# Patient Record
Sex: Male | Born: 1945 | Race: White | Hispanic: No | Marital: Married | Smoking: Former smoker
Health system: Southern US, Community
[De-identification: ages and names within clinical notes are randomized; demographics above are authoritative.]

## PROBLEM LIST (undated history)

## (undated) DIAGNOSIS — K3184 Gastroparesis: Secondary | ICD-10-CM

## (undated) DIAGNOSIS — E7800 Pure hypercholesterolemia, unspecified: Secondary | ICD-10-CM

## (undated) DIAGNOSIS — K2970 Gastritis, unspecified, without bleeding: Secondary | ICD-10-CM

## (undated) DIAGNOSIS — N399 Disorder of urinary system, unspecified: Secondary | ICD-10-CM

## (undated) DIAGNOSIS — I10 Essential (primary) hypertension: Secondary | ICD-10-CM

## (undated) DIAGNOSIS — E1140 Type 2 diabetes mellitus with diabetic neuropathy, unspecified: Secondary | ICD-10-CM

## (undated) DIAGNOSIS — G44009 Cluster headache syndrome, unspecified, not intractable: Secondary | ICD-10-CM

## (undated) DIAGNOSIS — N3090 Cystitis, unspecified without hematuria: Secondary | ICD-10-CM

## (undated) DIAGNOSIS — K5190 Ulcerative colitis, unspecified, without complications: Secondary | ICD-10-CM

## (undated) DIAGNOSIS — I4819 Other persistent atrial fibrillation: Secondary | ICD-10-CM

## (undated) DIAGNOSIS — R351 Nocturia: Secondary | ICD-10-CM

## (undated) DIAGNOSIS — N200 Calculus of kidney: Secondary | ICD-10-CM

## (undated) DIAGNOSIS — R350 Frequency of micturition: Secondary | ICD-10-CM

## (undated) DIAGNOSIS — I8500 Esophageal varices without bleeding: Secondary | ICD-10-CM

## (undated) DIAGNOSIS — K7460 Unspecified cirrhosis of liver: Secondary | ICD-10-CM

## (undated) DIAGNOSIS — D731 Hypersplenism: Secondary | ICD-10-CM

## (undated) DIAGNOSIS — F17200 Nicotine dependence, unspecified, uncomplicated: Secondary | ICD-10-CM

## (undated) DIAGNOSIS — K219 Gastro-esophageal reflux disease without esophagitis: Secondary | ICD-10-CM

## (undated) DIAGNOSIS — E119 Type 2 diabetes mellitus without complications: Secondary | ICD-10-CM

## (undated) DIAGNOSIS — K220 Achalasia of cardia: Secondary | ICD-10-CM

## (undated) DIAGNOSIS — E785 Hyperlipidemia, unspecified: Secondary | ICD-10-CM

## (undated) DIAGNOSIS — G4730 Sleep apnea, unspecified: Secondary | ICD-10-CM

## (undated) DIAGNOSIS — R519 Headache, unspecified: Secondary | ICD-10-CM

## (undated) DIAGNOSIS — G629 Polyneuropathy, unspecified: Secondary | ICD-10-CM

## (undated) DIAGNOSIS — Z950 Presence of cardiac pacemaker: Secondary | ICD-10-CM

## (undated) DIAGNOSIS — N189 Chronic kidney disease, unspecified: Secondary | ICD-10-CM

## (undated) DIAGNOSIS — H9190 Unspecified hearing loss, unspecified ear: Secondary | ICD-10-CM

## (undated) DIAGNOSIS — N400 Enlarged prostate without lower urinary tract symptoms: Secondary | ICD-10-CM

## (undated) DIAGNOSIS — I4891 Unspecified atrial fibrillation: Secondary | ICD-10-CM

## (undated) DIAGNOSIS — K5900 Constipation, unspecified: Secondary | ICD-10-CM

## (undated) DIAGNOSIS — G459 Transient cerebral ischemic attack, unspecified: Secondary | ICD-10-CM

## (undated) DIAGNOSIS — R51 Headache: Secondary | ICD-10-CM

## (undated) DIAGNOSIS — D61818 Other pancytopenia: Secondary | ICD-10-CM

## (undated) DIAGNOSIS — I499 Cardiac arrhythmia, unspecified: Secondary | ICD-10-CM

## (undated) HISTORY — DX: Essential (primary) hypertension: I10

## (undated) HISTORY — DX: Type 2 diabetes mellitus with diabetic neuropathy, unspecified (CMS HCC): E11.40

## (undated) HISTORY — PX: HX GALL BLADDER SURGERY/CHOLE: SHX55

## (undated) HISTORY — DX: Type 2 diabetes mellitus without complications (CMS HCC): E11.9

## (undated) HISTORY — PX: HX LITHOTRIPSY: SHX66

## (undated) HISTORY — DX: Achalasia of cardia: K22.0

## (undated) HISTORY — DX: Hypersplenism: D73.1

## (undated) HISTORY — DX: Disorder of urinary system, unspecified: N39.9

## (undated) HISTORY — DX: Nocturia: R35.1

## (undated) HISTORY — PX: URETEROSCOPY: SHX842

## (undated) HISTORY — PX: HX KNEE SURGERY: 2100001320

## (undated) HISTORY — PX: NOSE SURGERY: SHX723

## (undated) HISTORY — DX: Gastroparesis: K31.84

## (undated) HISTORY — DX: Gastro-esophageal reflux disease without esophagitis: K21.9

## (undated) HISTORY — DX: Pure hypercholesterolemia, unspecified: E78.00

## (undated) HISTORY — DX: Calculus of kidney: N20.0

## (undated) HISTORY — DX: Unspecified cirrhosis of liver (CMS HCC): K74.60

## (undated) HISTORY — PX: COLONOSCOPY: WVUENDOPRO10

## (undated) HISTORY — DX: Nicotine dependence, unspecified, uncomplicated: F17.200

## (undated) HISTORY — DX: Gastritis, unspecified, without bleeding: K29.70

## (undated) HISTORY — DX: Cluster headache syndrome, unspecified, not intractable: G44.009

## (undated) HISTORY — DX: Cystitis, unspecified without hematuria: N30.90

## (undated) HISTORY — DX: Ulcerative colitis, unspecified, without complications (CMS HCC): K51.90

## (undated) HISTORY — DX: Frequency of micturition: R35.0

## (undated) HISTORY — DX: Other persistent atrial fibrillation: I48.19

## (undated) HISTORY — PX: HX KNEE REPLACMENT: SHX125

## (undated) HISTORY — DX: Esophageal varices without bleeding (CMS HCC): I85.00

## (undated) HISTORY — PX: CHOLECYSTECTOMY: SHX55

## (undated) HISTORY — PX: LITHOTRIPSY: SUR834

## (undated) HISTORY — PX: VASECTOMY: SHX75

## (undated) HISTORY — PX: KIDNEY SURGERY: SHX687

## (undated) HISTORY — PX: JOINT REPLACEMENT: SHX530

---

## 2009-09-03 ENCOUNTER — Other Ambulatory Visit: Payer: Self-pay

## 2009-09-04 LAB — HISTORICAL SURGICAL PATHOLOGY SPECIMEN

## 2009-09-09 ENCOUNTER — Other Ambulatory Visit: Payer: Self-pay

## 2009-09-10 LAB — HISTORICAL SURGICAL PATHOLOGY SPECIMEN

## 2009-12-04 ENCOUNTER — Other Ambulatory Visit: Payer: Self-pay

## 2009-12-05 LAB — HISTORICAL SURGICAL PATHOLOGY SPECIMEN

## 2011-07-07 ENCOUNTER — Inpatient Hospital Stay (HOSPITAL_COMMUNITY): Payer: Self-pay | Admitting: Specialist

## 2015-08-19 ENCOUNTER — Emergency Department: Payer: Medicare Other

## 2015-08-19 ENCOUNTER — Observation Stay: Payer: Medicare Other

## 2015-08-19 ENCOUNTER — Encounter: Payer: Self-pay | Admitting: Emergency Medicine

## 2015-08-19 ENCOUNTER — Observation Stay
Admission: EM | Admit: 2015-08-19 | Discharge: 2015-08-20 | Disposition: A | Discharge status: Home / Self Care | Payer: Medicare Other | Attending: Specialist | Admitting: Specialist

## 2015-08-19 ENCOUNTER — Observation Stay
Admit: 2015-08-19 | Discharge: 2015-08-19 | Disposition: A | Discharge status: Home / Self Care | Payer: Medicare Other | Attending: Internal Medicine | Admitting: Internal Medicine

## 2015-08-19 DIAGNOSIS — D72819 Decreased white blood cell count, unspecified: Secondary | ICD-10-CM | POA: Insufficient documentation

## 2015-08-19 DIAGNOSIS — I119 Hypertensive heart disease without heart failure: Secondary | ICD-10-CM | POA: Insufficient documentation

## 2015-08-19 DIAGNOSIS — Z23 Encounter for immunization: Secondary | ICD-10-CM | POA: Diagnosis not present

## 2015-08-19 DIAGNOSIS — R4781 Slurred speech: Secondary | ICD-10-CM | POA: Insufficient documentation

## 2015-08-19 DIAGNOSIS — I700 Atherosclerosis of aorta: Secondary | ICD-10-CM | POA: Insufficient documentation

## 2015-08-19 DIAGNOSIS — E785 Hyperlipidemia, unspecified: Secondary | ICD-10-CM | POA: Diagnosis not present

## 2015-08-19 DIAGNOSIS — G459 Transient cerebral ischemic attack, unspecified: Principal | ICD-10-CM | POA: Diagnosis present

## 2015-08-19 DIAGNOSIS — Z79899 Other long term (current) drug therapy: Secondary | ICD-10-CM | POA: Insufficient documentation

## 2015-08-19 DIAGNOSIS — K219 Gastro-esophageal reflux disease without esophagitis: Secondary | ICD-10-CM | POA: Diagnosis not present

## 2015-08-19 DIAGNOSIS — Z87891 Personal history of nicotine dependence: Secondary | ICD-10-CM | POA: Insufficient documentation

## 2015-08-19 DIAGNOSIS — R55 Syncope and collapse: Secondary | ICD-10-CM | POA: Diagnosis not present

## 2015-08-19 DIAGNOSIS — X58XXXA Exposure to other specified factors, initial encounter: Secondary | ICD-10-CM | POA: Insufficient documentation

## 2015-08-19 DIAGNOSIS — I252 Old myocardial infarction: Secondary | ICD-10-CM | POA: Insufficient documentation

## 2015-08-19 DIAGNOSIS — D696 Thrombocytopenia, unspecified: Secondary | ICD-10-CM | POA: Diagnosis not present

## 2015-08-19 DIAGNOSIS — E1140 Type 2 diabetes mellitus with diabetic neuropathy, unspecified: Secondary | ICD-10-CM | POA: Diagnosis not present

## 2015-08-19 DIAGNOSIS — R0789 Other chest pain: Secondary | ICD-10-CM | POA: Insufficient documentation

## 2015-08-19 DIAGNOSIS — Z823 Family history of stroke: Secondary | ICD-10-CM | POA: Diagnosis not present

## 2015-08-19 DIAGNOSIS — Z794 Long term (current) use of insulin: Secondary | ICD-10-CM | POA: Insufficient documentation

## 2015-08-19 DIAGNOSIS — S2232XA Fracture of one rib, left side, initial encounter for closed fracture: Secondary | ICD-10-CM | POA: Diagnosis not present

## 2015-08-19 DIAGNOSIS — R4701 Aphasia: Secondary | ICD-10-CM | POA: Diagnosis not present

## 2015-08-19 DIAGNOSIS — Z8249 Family history of ischemic heart disease and other diseases of the circulatory system: Secondary | ICD-10-CM | POA: Insufficient documentation

## 2015-08-19 DIAGNOSIS — R29810 Facial weakness: Secondary | ICD-10-CM | POA: Diagnosis not present

## 2015-08-19 DIAGNOSIS — E119 Type 2 diabetes mellitus without complications: Secondary | ICD-10-CM

## 2015-08-19 HISTORY — DX: Gastro-esophageal reflux disease without esophagitis: K21.9

## 2015-08-19 HISTORY — DX: Type 2 diabetes mellitus without complications: E11.9

## 2015-08-19 HISTORY — DX: Hyperlipidemia, unspecified: E78.5

## 2015-08-19 HISTORY — DX: Essential (primary) hypertension: I10

## 2015-08-19 HISTORY — DX: Constipation, unspecified: K59.00

## 2015-08-19 LAB — COMPREHENSIVE METABOLIC PANEL
ALT: 52 U/L (ref 17–63)
AST: 55 U/L — AB (ref 15–41)
Albumin: 3.7 g/dL (ref 3.5–5.0)
Alkaline Phosphatase: 113 U/L (ref 38–126)
Anion gap: 6 (ref 5–15)
BILIRUBIN TOTAL: 1.2 mg/dL (ref 0.3–1.2)
BUN: 17 mg/dL (ref 6–20)
CO2: 27 mmol/L (ref 22–32)
CREATININE: 0.73 mg/dL (ref 0.61–1.24)
Calcium: 9 mg/dL (ref 8.9–10.3)
Chloride: 104 mmol/L (ref 101–111)
GFR calc Af Amer: >60 mL/min (ref >60)
Glucose, Bld: 255 mg/dL — ABNORMAL HIGH (ref 65–99)
POTASSIUM: 3.9 mmol/L (ref 3.5–5.1)
Sodium: 137 mmol/L (ref 135–145)
TOTAL PROTEIN: 6.1 g/dL — AB (ref 6.5–8.1)

## 2015-08-19 LAB — CBC
HEMATOCRIT: 38.8 % — AB (ref 40.0–52.0)
HEMOGLOBIN: 13 g/dL (ref 13.0–18.0)
MCH: 35.8 pg — ABNORMAL HIGH (ref 26.0–34.0)
MCHC: 33.6 g/dL (ref 32.0–36.0)
MCV: 106.6 fL — AB (ref 80.0–100.0)
Platelets: 64 10*3/uL — ABNORMAL LOW (ref 150–440)
RBC: 3.64 MIL/uL — ABNORMAL LOW (ref 4.40–5.90)
RDW: 13.4 % (ref 11.5–14.5)
WBC: 3.4 10*3/uL — ABNORMAL LOW (ref 3.8–10.6)

## 2015-08-19 LAB — DIFFERENTIAL
BASOS ABS: 0 10*3/uL (ref 0–0.1)
Basophils Relative: 0 %
EOS ABS: 0 10*3/uL (ref 0–0.7)
Eosinophils Relative: 1 %
LYMPHS ABS: 0.9 10*3/uL — AB (ref 1.0–3.6)
Lymphocytes Relative: 26 %
Monocytes Absolute: 0.4 10*3/uL (ref 0.2–1.0)
Neutro Abs: 2.1 10*3/uL (ref 1.4–6.5)
Neutrophils Relative %: 62 %

## 2015-08-19 LAB — PROTIME-INR
INR: 1.37
Prothrombin Time: 17 seconds — ABNORMAL HIGH (ref 11.4–15.0)

## 2015-08-19 LAB — APTT: APTT: 29 s (ref 24–36)

## 2015-08-19 LAB — TROPONIN I

## 2015-08-19 LAB — GLUCOSE, CAPILLARY
GLUCOSE-CAPILLARY: 185 mg/dL — AB (ref 65–99)
Glucose-Capillary: 137 mg/dL — ABNORMAL HIGH (ref 65–99)

## 2015-08-19 MED ORDER — SENNOSIDES-DOCUSATE SODIUM 8.6-50 MG PO TABS: 1.0000 | ORAL_TABLET | Freq: Every evening | ORAL | Status: DC | PRN

## 2015-08-19 MED ORDER — FERROUS SULFATE 325 (65 FE) MG PO TABS
325.0000 mg | ORAL_TABLET | Freq: Two times a day (BID) | ORAL | Status: DC
  Administered 2015-08-19 – 2015-08-20 (×2): 325 mg via ORAL
  Filled 2015-08-19 (×2): qty 1

## 2015-08-19 MED ORDER — NIACIN 500 MG PO TABS
1000.0000 mg | ORAL_TABLET | Freq: Two times a day (BID) | ORAL | Status: DC
  Administered 2015-08-19 – 2015-08-20 (×2): 1000 mg via ORAL
  Filled 2015-08-19 (×3): qty 2

## 2015-08-19 MED ORDER — ASPIRIN 325 MG PO TABS
325.0000 mg | ORAL_TABLET | Freq: Every day | ORAL | Status: DC
  Administered 2015-08-20: 325 mg via ORAL
  Filled 2015-08-19: qty 1

## 2015-08-19 MED ORDER — SULFASALAZINE 500 MG PO TABS
1000.0000 mg | ORAL_TABLET | Freq: Two times a day (BID) | ORAL | Status: DC
  Administered 2015-08-19 – 2015-08-20 (×2): 1000 mg via ORAL
  Filled 2015-08-19 (×3): qty 2

## 2015-08-19 MED ORDER — CAPTOPRIL 12.5 MG PO TABS
12.5000 mg | ORAL_TABLET | Freq: Two times a day (BID) | ORAL | Status: DC
  Administered 2015-08-19 – 2015-08-20 (×2): 12.5 mg via ORAL
  Filled 2015-08-19 (×3): qty 1

## 2015-08-19 MED ORDER — ENOXAPARIN SODIUM 40 MG/0.4ML ~~LOC~~ SOLN
40.0000 mg | SUBCUTANEOUS | Status: DC
  Administered 2015-08-19: 17:00:00 40 mg via SUBCUTANEOUS
  Filled 2015-08-19: qty 0.4

## 2015-08-19 MED ORDER — LOPERAMIDE HCL 2 MG PO CAPS: 2.0000 mg | ORAL_CAPSULE | ORAL | Status: DC | PRN

## 2015-08-19 MED ORDER — INSULIN DETEMIR 100 UNIT/ML ~~LOC~~ SOLN
16.0000 [IU] | Freq: Two times a day (BID) | SUBCUTANEOUS | Status: DC
  Administered 2015-08-19 – 2015-08-20 (×2): 16 [IU] via SUBCUTANEOUS
  Filled 2015-08-19 (×3): qty 0.16

## 2015-08-19 MED ORDER — INSULIN ASPART 100 UNIT/ML ~~LOC~~ SOLN
0.0000 [IU] | Freq: Three times a day (TID) | SUBCUTANEOUS | Status: DC
  Administered 2015-08-19: 17:00:00 1 [IU] via SUBCUTANEOUS
  Administered 2015-08-20: 2 [IU] via SUBCUTANEOUS
  Filled 2015-08-19: qty 1
  Filled 2015-08-19: qty 2

## 2015-08-19 MED ORDER — DOCUSATE SODIUM 100 MG PO CAPS
100.0000 mg | ORAL_CAPSULE | Freq: Two times a day (BID) | ORAL | Status: DC
  Administered 2015-08-19 – 2015-08-20 (×2): 100 mg via ORAL
  Filled 2015-08-19 (×2): qty 1

## 2015-08-19 MED ORDER — CLONIDINE HCL 0.1 MG PO TABS
0.1000 mg | ORAL_TABLET | Freq: Three times a day (TID) | ORAL | Status: DC
  Administered 2015-08-19 – 2015-08-20 (×3): 0.1 mg via ORAL
  Filled 2015-08-19 (×3): qty 1

## 2015-08-19 MED ORDER — VERAPAMIL HCL 120 MG PO TABS
120.0000 mg | ORAL_TABLET | Freq: Three times a day (TID) | ORAL | Status: DC
  Administered 2015-08-19 – 2015-08-20 (×2): 120 mg via ORAL
  Filled 2015-08-19 (×5): qty 1

## 2015-08-19 MED ORDER — INFLUENZA VAC SPLIT QUAD 0.5 ML IM SUSY
0.5000 mL | PREFILLED_SYRINGE | INTRAMUSCULAR | Status: AC
  Administered 2015-08-20: 14:00:00 0.5 mL via INTRAMUSCULAR
  Filled 2015-08-19: qty 0.5

## 2015-08-19 MED ORDER — STROKE: EARLY STAGES OF RECOVERY BOOK
Freq: Once | Status: AC
  Administered 2015-08-19: 17:00:00

## 2015-08-19 MED ORDER — INSULIN ASPART 100 UNIT/ML ~~LOC~~ SOLN: 0.0000 [IU] | Freq: Every day | SUBCUTANEOUS | Status: DC

## 2015-08-19 MED ORDER — PRAVASTATIN SODIUM 40 MG PO TABS
40.0000 mg | ORAL_TABLET | Freq: Every day | ORAL | Status: DC
  Administered 2015-08-20: 09:00:00 40 mg via ORAL
  Filled 2015-08-19 (×2): qty 1

## 2015-08-19 MED ORDER — PANTOPRAZOLE SODIUM 40 MG PO TBEC
40.0000 mg | DELAYED_RELEASE_TABLET | Freq: Every day | ORAL | Status: DC
  Administered 2015-08-19 – 2015-08-20 (×2): 40 mg via ORAL
  Filled 2015-08-19 (×2): qty 1

## 2015-08-19 MED ORDER — GABAPENTIN 300 MG PO CAPS
900.0000 mg | ORAL_CAPSULE | Freq: Three times a day (TID) | ORAL | Status: DC
  Administered 2015-08-19 – 2015-08-20 (×3): 900 mg via ORAL
  Filled 2015-08-19 (×3): qty 3

## 2015-08-19 MED ORDER — ASPIRIN 300 MG RE SUPP: 300.0000 mg | Freq: Every day | RECTAL | Status: DC

## 2015-08-19 MED ORDER — POTASSIUM CHLORIDE CRYS ER 10 MEQ PO TBCR
10.0000 meq | EXTENDED_RELEASE_TABLET | Freq: Every day | ORAL | Status: DC
  Administered 2015-08-19 – 2015-08-20 (×2): 10 meq via ORAL
  Filled 2015-08-19 (×2): qty 1

## 2015-08-19 NOTE — ED Provider Notes (Signed)
[facility] Emergency Department Provider Note  ____________________________________________  Time seen: Approximately 12:27 PM  I have reviewed the triage vital signs and the nursing notes.   HISTORY  Chief Complaint Aphasia    HPI Bernard Pennington is a 69 y.o. male whose wife reports Saturday evening he got up from a nap and he had a droopy right side of face and had very slurry speech which she could not understand. The symptoms resolved within several minutes he did not want to go to the hospital so she did not take them. They're here visiting relatives and came in to be checked. There was an episode reported to the triage nurse of difficulty standing and walking last week as well which the patient does not now remember when I asked him about it. Patient reports he has had some chest pain which resolved with rest he had a stress test done in [facility] which she was initially told was positive and then was told was negative. We're attempting to get the report of that from that hospital.   Past Medical History  Diagnosis Date  . Hypertension   . Constipation   . Diabetes mellitus without complication ([place])   . GERD (gastroesophageal reflux disease)   . Hyperlipemia   . MI (myocardial infarction) ([place])    patient's past medical history also includes ulcerative colitis. Migraines closed head injury in [place] years ago and high cholesterol. Patient also has diabetic neuropathy in the feet  There are no active problems to display for this patient.   History reviewed. No pertinent past surgical history.  No current outpatient prescriptions on file.  Allergies Review of patient's allergies indicates no known allergies.  Family History  Problem Relation Age of Onset  . Heart attack Mother   . Stroke Father   . Cancer Father     Social History Social History  Substance Use Topics  . Smoking status: Former Research scientist (life sciences)  . Smokeless tobacco: None  .  Alcohol Use: Yes    Review of Systems Constitutional: No fever/chills Eyes: No visual changes. ENT: No sore throat. Cardiovascular: Denies chest pain. But see history of present illness Respiratory: Denies shortness of breath. Gastrointestinal: No abdominal pain.  No nausea, no vomiting.  No diarrhea.  No constipation. Genitourinary: Negative for dysuria. Musculoskeletal: Negative for back pain. Skin: Negative for rash. Neurological: Negative for headaches, focal weakness   10-point ROS otherwise negative.  ____________________________________________   PHYSICAL EXAM:  VITAL SIGNS: ED Triage Vitals  Enc Vitals Group     BP 08/19/15 1127 152/70 mmHg     Pulse Rate 08/19/15 1127 70     Resp 08/19/15 1127 20     Temp 08/19/15 1127 98.2 F (36.8 C)     Temp Source 08/19/15 1127 Oral     SpO2 08/19/15 1127 95 %     Weight 08/19/15 1127 200 lb (90.719 kg)     Height 08/19/15 1127 6\' 2"  (1.88 m)     Head Cir --      Peak Flow --      Pain Score 08/19/15 1128 3     Pain Loc --      Pain Edu? --      Excl. in [place]? --     Constitutional: Alert and oriented. Well appearing and in no acute distress. Eyes: Conjunctivae are normal. PERRL. EOMI. Head: Atraumatic. Nose: No congestion/rhinnorhea. Mouth/Throat: Mucous membranes are moist.  Oropharynx non-erythematous. Neck: No stridor.  No bruits  Cardiovascular: Normal rate, regular rhythm. Grossly normal heart sounds.  Good peripheral circulation. Respiratory: Normal respiratory effort.  No retractions. Lungs CTAB. Gastrointestinal: Soft and nontender. No distention. No abdominal bruits. No CVA tenderness. }Musculoskeletal: No lower extremity tenderness nor edema.  No joint effusions. Neurologic:  Normal speech and language. No gross focal neurologic deficits are appreciated. Cranial nerves II through XII are intact cerebellar finger-nose rapid alternating movements in the hands are normal heel-to-shin is slow but that's  bilaterally. There are no sensory or motor motor findings  Skin:  Skin is warm, dry and intact. No rash noted. Psychiatric: Mood and affect are normal. Speech and behavior are normal.  ____________________________________________   LABS (all labs ordered are listed, but only abnormal results are displayed)  Labs Reviewed  PROTIME-INR - Abnormal; Notable for the following:    Prothrombin Time 17.0 (*)    All other components within normal limits  CBC - Abnormal; Notable for the following:    WBC 3.4 (*)    RBC 3.64 (*)    HCT 38.8 (*)    MCV 106.6 (*)    MCH 35.8 (*)    Platelets 64 (*)    All other components within normal limits  DIFFERENTIAL - Abnormal; Notable for the following:    Lymphs Abs 0.9 (*)    All other components within normal limits  COMPREHENSIVE METABOLIC PANEL - Abnormal; Notable for the following:    Glucose, Bld 255 (*)    Total Protein 6.1 (*)    AST 55 (*)    All other components within normal limits  APTT  TROPONIN I  CBG MONITORING, ED   ____________________________________________  EKG  EKG shows normal sinus rhythm rate of 76 but there are frequent PVCs fact that almost looks like trigeminy. The axis is left axis  ____________________________________________  RADIOLOGY  CT shows no acute process Chest x-ray shows no acute process ____________________________________________   PROCEDURES    ____________________________________________   INITIAL IMPRESSION / ASSESSMENT AND PLAN / ED COURSE  Pertinent labs & imaging results that were available during my care of the patient were reviewed by me and considered in my medical decision making (see chart for details).   ____________________________________________   FINAL CLINICAL IMPRESSION(S) / ED DIAGNOSES  Final diagnoses:  Transient cerebral ischemia, unspecified transient cerebral ischemia type      [name], MD 08/19/15 1331

## 2015-08-19 NOTE — ED Notes (Signed)
Pt returned from ct; signed release form for medical records.

## 2015-08-19 NOTE — ED Notes (Addendum)
Pt from home (visiting family in the area) after having droopy face and slurred speech on Saturday night. Symptoms have resolved but pt's family wanted him evaluated. Pt also reports having difficulty standing and speaking one night a couple of weeks ago. NAD noted at this time. Wife comments that he seems to be sleeping more than usual lately.

## 2015-08-19 NOTE — ED Notes (Signed)
Pt to ed with c/o slurred speech and right sided facial drooping that started on Sat night late.  Pt wife awoke him from sleep in the chair and states pt had garbled speech and facial drooping for several minutes and then it resolved on its own.  Per pt he had similar episode last week.  Pt alert and oriented at this time.  No facial drooping noted, no slurred or garbled speech noted at this time.

## 2015-08-19 NOTE — Care Management Obs Status (Signed)
[facility] NOTIFICATION   Patient Details  Name: Brodix Salvino MRN: [id number] Date of Birth: September 16, 1946   Medicare Observation Status Notification Given:  Yes, sent to HIM    Beverly Sessions, RN 08/19/2015, 2:52 PM

## 2015-08-19 NOTE — Progress Notes (Signed)
*  PRELIMINARY RESULTS* Echocardiogram 2D Echocardiogram has been performed.  Bernard Pennington 08/19/2015, 9:04 PM

## 2015-08-19 NOTE — H&P (Signed)
[place] [facility] [place] NAME: Bernard Pennington    MR#:  [id number]  DATE OF BIRTH:  December 02, 1945  DATE OF ADMISSION:  08/19/2015  PRIMARY CARE PHYSICIAN: No primary care provider on file.   REQUESTING/REFERRING PHYSICIAN: [name], MD  CHIEF COMPLAINT:   Chief Complaint  Patient presents with  . Aphasia   slurred speech and resolve 2 days ago  HISTORY OF PRESENT ILLNESS:  Bernard Pennington  is a 69 y.o. male with a known history of hypertension, diabetes, recent MI and hyperlipidemia.  The patient was found to have a droopy on the right side of the face and slurred speech 3 days ago. Slurred speech resolved on the same day. The patient's family called EMS but has since symptoms resolved the patient didn't come to the ED. However, according to patient's wife patient has had similar episodes for the past few months. She has concern for stroke. The patient is traveling from [place] to [place]. He denies any other symptoms.  PAST MEDICAL HISTORY:   Past Medical History  Diagnosis Date  . Hypertension   . Constipation   . Diabetes mellitus without complication ([place])   . GERD (gastroesophageal reflux disease)   . Hyperlipemia   . MI (myocardial infarction) ([place])     PAST SURGICAL HISTORY:  History reviewed. No pertinent past surgical history.  SOCIAL HISTORY:   Social History  Substance Use Topics  . Smoking status: Former Research scientist (life sciences)  . Smokeless tobacco: Not on file  . Alcohol Use: Yes    FAMILY HISTORY:   Family History  Problem Relation Age of Onset  . Heart attack Mother   . Stroke Father   . Cancer Father     DRUG ALLERGIES:  No Known Allergies  REVIEW OF SYSTEMS:  CONSTITUTIONAL: No fever, fatigue or weakness.  EYES: No blurred or double vision.  EARS, NOSE, AND THROAT: No tinnitus or ear pain.  RESPIRATORY: No cough, shortness of breath, wheezing or hemoptysis.  CARDIOVASCULAR: No chest pain,  orthopnea, edema.  GASTROINTESTINAL: No nausea, vomiting, diarrhea or abdominal pain.  GENITOURINARY: No dysuria, hematuria.  ENDOCRINE: No polyuria, nocturia,  HEMATOLOGY: No anemia, easy bruising or bleeding SKIN: No rash or lesion. MUSCULOSKELETAL: No joint pain or arthritis.   NEUROLOGIC: No tingling, numbness, weakness. Resolved slurred speech 2 days ago that no dysphagia, syncope or loss of consciousness. No incontinence. PSYCHIATRY: No anxiety or depression.   MEDICATIONS AT HOME:   Prior to Admission medications   Medication Sig Start Date End Date Taking? Authorizing Provider  Ascorbic Acid (VITAMIN C) 1000 MG tablet Take 1,000 mg by mouth 3 (three) times daily.   Yes Historical Provider, MD  captopril (CAPOTEN) 12.5 MG tablet Take 12.5 mg by mouth 2 (two) times daily.   Yes Historical Provider, MD  CINNAMON PO Take 1 tablet by mouth 3 (three) times daily before meals.   Yes Historical Provider, MD  cloNIDine (CATAPRES) 0.1 MG tablet Take 0.1 mg by mouth 3 (three) times daily.   Yes Historical Provider, MD  docusate sodium (COLACE) 100 MG capsule Take 100 mg by mouth 2 (two) times daily.   Yes Historical Provider, MD  ferrous sulfate 325 (65 FE) MG tablet Take 325 mg by mouth 2 (two) times daily.   Yes Historical Provider, MD  gabapentin (NEURONTIN) 300 MG capsule Take 900 mg by mouth 3 (three) times daily.   Yes Historical Provider, MD  insulin NPH Human (HUMULIN N,NOVOLIN N)  100 UNIT/ML injection Inject 16 Units into the skin 2 (two) times daily.   Yes Historical Provider, MD  insulin regular (NOVOLIN R,HUMULIN R) 100 units/mL injection Inject 6-10 Units into the skin 4 (four) times daily. Pt uses per sliding scale.   Yes Historical Provider, MD  loperamide (IMODIUM) 2 MG capsule Take 2 mg by mouth as needed for diarrhea or loose stools.   Yes Historical Provider, MD  metFORMIN (GLUCOPHAGE) 500 MG tablet Take 500 mg by mouth 2 (two) times daily with a meal.   Yes Historical  Provider, MD  Multiple Vitamin (MULTIVITAMIN WITH MINERALS) TABS tablet Take 1 tablet by mouth daily.   Yes Historical Provider, MD  niacin 500 MG tablet Take 1,000 mg by mouth 2 (two) times daily.   Yes Historical Provider, MD  Omega-3 Fatty Acids (FISH OIL) 1000 MG CAPS Take 1,000 mg by mouth daily.   Yes Historical Provider, MD  omeprazole (PRILOSEC) 20 MG capsule Take 40 mg by mouth daily.   Yes Historical Provider, MD  potassium chloride SA (K-DUR,KLOR-CON) 20 MEQ tablet Take 10 mEq by mouth daily.   Yes Historical Provider, MD  pravastatin (PRAVACHOL) 40 MG tablet Take 40 mg by mouth daily.   Yes Historical Provider, MD  ranitidine (ZANTAC) 150 MG tablet Take 150 mg by mouth 2 (two) times daily as needed for heartburn.   Yes Historical Provider, MD  sulfaSALAzine (AZULFIDINE) 500 MG tablet Take 1,000 mg by mouth 2 (two) times daily.   Yes Historical Provider, MD  verapamil (CALAN) 120 MG tablet Take 120 mg by mouth 3 (three) times daily.   Yes Historical Provider, MD  VITAMIN E PO Take 1 capsule by mouth 2 (two) times daily.   Yes Historical Provider, MD      VITAL SIGNS:  Blood pressure 139/72, pulse 61, temperature 98.2 F (36.8 C), temperature source Oral, resp. rate 14, height 6\' 2"  (1.88 m), weight 90.719 kg (200 lb), SpO2 98 %.  PHYSICAL EXAMINATION:  GENERAL:  69 y.o.-year-old patient lying in the bed with no acute distress.  EYES: Pupils equal, round, reactive to light and accommodation. No scleral icterus. Extraocular muscles intact.  HEENT: Head atraumatic, normocephalic. Oropharynx and nasopharynx clear. Moist oral mucosa. NECK:  Supple, no jugular venous distention. No thyroid enlargement, no tenderness.  LUNGS: Normal breath sounds bilaterally, no wheezing, rales,rhonchi or crepitation. No use of accessory muscles of respiration.  CARDIOVASCULAR: S1, S2 normal. No murmurs, rubs, or gallops.  ABDOMEN: Soft, nontender, nondistended. Bowel sounds present. No organomegaly or  mass.  EXTREMITIES: No pedal edema, cyanosis, or clubbing.  NEUROLOGIC: Cranial nerves II through XII are intact. Muscle strength 5/5 in all extremities. Sensation intact. Gait not checked.  PSYCHIATRIC: The patient is alert and oriented x 3.  SKIN: No obvious rash, lesion, or ulcer.   LABORATORY PANEL:   CBC  Recent Labs Lab 08/19/15 1151  WBC 3.4*  HGB 13.0  HCT 38.8*  PLT 64*   ------------------------------------------------------------------------------------------------------------------  Chemistries   Recent Labs Lab 08/19/15 1151  NA 137  K 3.9  CL 104  CO2 27  GLUCOSE 255*  BUN 17  CREATININE 0.73  CALCIUM 9.0  AST 55*  ALT 52  ALKPHOS 113  BILITOT 1.2   ------------------------------------------------------------------------------------------------------------------  Cardiac Enzymes  Recent Labs Lab 08/19/15 1151  TROPONINI <0.03   ------------------------------------------------------------------------------------------------------------------  RADIOLOGY:  Dg Chest 2 View  08/19/2015  CLINICAL DATA:  69 year old male with a several month history of shortness of breath and left-sided chest  discomfort EXAM: CHEST  2 VIEW COMPARISON:  None. FINDINGS: The cardiac and mediastinal contours are within normal limits. Atherosclerotic calcifications are present within the thoracic aorta. No evidence of focal airspace consolidation, pleural effusion, pulmonary edema, pneumothorax or suspicious nodule. Age indeterminate but likely chronic fracture of left rib 7. Otherwise, no evidence of acute fracture or malalignment. IMPRESSION: 1. No acute cardiopulmonary process. 2. Aortic atherosclerosis. 3. Likely chronic fracture of left rib 7. Electronically Signed   By: Jacqulynn Cadet M.D.   On: 08/19/2015 12:54   Ct Head Wo Contrast  08/19/2015  CLINICAL DATA:  Slurred speech, right facial droop beginning Saturday night. EXAM: CT HEAD WITHOUT CONTRAST TECHNIQUE:  Contiguous axial images were obtained from the base of the skull through the vertex without intravenous contrast. COMPARISON:  None. FINDINGS: No acute intracranial abnormality. Specifically, no hemorrhage, hydrocephalus, mass lesion, acute infarction, or significant intracranial injury. No acute calvarial abnormality. Visualized paranasal sinuses and mastoids clear. Orbital soft tissues unremarkable. IMPRESSION: No acute intracranial abnormality. Electronically Signed   By: Rolm Baptise M.D.   On: 08/19/2015 12:48    EKG:   Orders placed or performed during the hospital encounter of 08/19/15  . EKG 12-Lead  . EKG 12-Lead  . ED EKG  . ED EKG    IMPRESSION AND PLAN:   TIA Hypertension Diabetes Recent MI Hyperlipidemia Leukopenia  Thrombocytopenia  The patient will be placed for observation. I will start TIA/stroke protocol. Give aspirin and statin, follow-up lipid panel, I MRI of the brain, carotid duplex and echocardiogram. Neuro check. Speech study, PT and OT evaluation. Continue hypertension medication but allow permissive blood pressure. Continue NPH and start sliding scale. Follow-up CBC.     All the records are reviewed and case discussed with ED provider. Management plans discussed with the patient, his wife and daughter and they are in agreement.  CODE STATUS: Full code  TOTAL TIME TAKING CARE OF THIS PATIENT: 48 minutes.    Demetrios Loll M.D on 08/19/2015 at 3:15 PM  Between 7am to 6pm - Pager - [phone]  After 6pm go to [email or web address] - password [email or web address] [facility]  [facility]  Office  [phone]  CC: Primary care physician; No primary care provider on file.

## 2015-08-19 NOTE — Plan of Care (Addendum)
Problem: Self-Care: Goal: Ability to participate in self-care as condition permits will improve Outcome: Progressing Patient has no complaints of pain. Telemetry monitored. NIH =0. VSS. Neuro checks performed. Patient has no deficits. Stroke booklet given to patient, all questions answered.

## 2015-08-20 ENCOUNTER — Observation Stay: Payer: Medicare Other

## 2015-08-20 DIAGNOSIS — G459 Transient cerebral ischemic attack, unspecified: Secondary | ICD-10-CM | POA: Diagnosis not present

## 2015-08-20 LAB — GLUCOSE, CAPILLARY
GLUCOSE-CAPILLARY: 80 mg/dL (ref 65–99)
Glucose-Capillary: 165 mg/dL — ABNORMAL HIGH (ref 65–99)

## 2015-08-20 LAB — LIPID PANEL
Cholesterol: 86 mg/dL (ref 0–200)
HDL: 19 mg/dL — AB (ref >40)
LDL Cholesterol: 55 mg/dL (ref 0–99)
TRIGLYCERIDES: 60 mg/dL (ref <150)
Total CHOL/HDL Ratio: 4.5 RATIO
VLDL: 12 mg/dL (ref 0–40)

## 2015-08-20 LAB — HEMOGLOBIN A1C: HEMOGLOBIN A1C: 5.4 % (ref 4.0–6.0)

## 2015-08-20 MED ORDER — VERAPAMIL HCL 120 MG PO TABS: 120.0000 mg | ORAL_TABLET | Freq: Two times a day (BID) | ORAL | Status: DC

## 2015-08-20 NOTE — Discharge Summary (Signed)
[facility] NAME: Bernard Pennington    MR#:  [id number]  DATE OF BIRTH:  Feb 04, 1946  DATE OF ADMISSION:  08/19/2015 ADMITTING PHYSICIAN: Demetrios Loll, MD  DATE OF DISCHARGE: 08/20/2015  2:29 PM  PRIMARY CARE PHYSICIAN: No primary care provider on file.    ADMISSION DIAGNOSIS:  Transient cerebral ischemia, unspecified transient cerebral ischemia type [G45.9]  DISCHARGE DIAGNOSIS:  Principal Problem:   TIA (transient ischemic attack)   SECONDARY DIAGNOSIS:   Past Medical History  Diagnosis Date  . Hypertension   . Constipation   . Diabetes mellitus without complication ([place])   . GERD (gastroesophageal reflux disease)   . Hyperlipemia   . MI (myocardial infarction) [facility])     HOSPITAL COURSE:   69 year old male with past medical history of diabetes, hypertension, hyperlipidemia, history of previous MI, who presented to the hospital due to slurred speech and therefore admitted for workup of possible stroke.  #1 slurred speech/CVA-this was a suspected diagnosis on admission. She does have significant risk factors given his history of diabetes, hypertension. -Patient underwent extensive neurologic workup including a CT head, MRI and MRA of the brain, carotid duplex are all essentially benign with no evidence of acute pathology. -Patient is clinically asymptomatic and therefore discharged home. He is not on aspirin daily given his thrombocytopenia and this can be further addressed with his primary care physician.  #2 bigeminy/PVCs-this was noted on his EKG and also on telemetry monitor. He was although clinically asymptomatic. He is currently on verapamil which she will continue and follow-up with his cardiologist as an outpatient. Patient does not live locally and follows up in [place]  #3 diabetes type 2 without, patient-patient will resume his home medications.  #4 hypertension-he remained hemodynamically stable.  He will continue his verapamil, clonidine.  #5 hyperlipidemia-patient will continue his Pravachol.  #6 GERD-patient will continue his omeprazole.   DISCHARGE CONDITIONS:   Stable  CONSULTS OBTAINED:     DRUG ALLERGIES:  No Known Allergies  DISCHARGE MEDICATIONS:   Discharge Medication List as of 08/20/2015 11:20 AM    CONTINUE these medications which have CHANGED   Details  verapamil (CALAN) 120 MG tablet Take 1 tablet (120 mg total) by mouth 2 (two) times daily., Starting 08/20/2015, Until Discontinued, No Print      CONTINUE these medications which have NOT CHANGED   Details  Ascorbic Acid (VITAMIN C) 1000 MG tablet Take 1,000 mg by mouth 3 (three) times daily., Until Discontinued, Historical Med    captopril (CAPOTEN) 12.5 MG tablet Take 12.5 mg by mouth 2 (two) times daily., Until Discontinued, Historical Med    CINNAMON PO Take 1 tablet by mouth 3 (three) times daily before meals., Until Discontinued, Historical Med    cloNIDine (CATAPRES) 0.1 MG tablet Take 0.1 mg by mouth 3 (three) times daily., Until Discontinued, Historical Med    docusate sodium (COLACE) 100 MG capsule Take 100 mg by mouth 2 (two) times daily., Until Discontinued, Historical Med    ferrous sulfate 325 (65 FE) MG tablet Take 325 mg by mouth 2 (two) times daily., Until Discontinued, Historical Med    gabapentin (NEURONTIN) 300 MG capsule Take 900 mg by mouth 3 (three) times daily., Until Discontinued, Historical Med    insulin NPH Human (HUMULIN N,NOVOLIN N) 100 UNIT/ML injection Inject 16 Units into the skin 2 (two) times daily., Until Discontinued, Historical Med    insulin regular (NOVOLIN R,HUMULIN R) 100 units/mL injection Inject  6-10 Units into the skin 4 (four) times daily. Pt uses per sliding scale., Until Discontinued, Historical Med    loperamide (IMODIUM) 2 MG capsule Take 2 mg by mouth as needed for diarrhea or loose stools., Until Discontinued, Historical Med    metFORMIN  (GLUCOPHAGE) 500 MG tablet Take 500 mg by mouth 2 (two) times daily with a meal., Until Discontinued, Historical Med    Multiple Vitamin (MULTIVITAMIN WITH MINERALS) TABS tablet Take 1 tablet by mouth daily., Until Discontinued, Historical Med    niacin 500 MG tablet Take 1,000 mg by mouth 2 (two) times daily., Until Discontinued, Historical Med    Omega-3 Fatty Acids (FISH OIL) 1000 MG CAPS Take 1,000 mg by mouth daily., Until Discontinued, Historical Med    omeprazole (PRILOSEC) 20 MG capsule Take 40 mg by mouth daily., Until Discontinued, Historical Med    potassium chloride SA (K-DUR,KLOR-CON) 20 MEQ tablet Take 10 mEq by mouth daily., Until Discontinued, Historical Med    pravastatin (PRAVACHOL) 40 MG tablet Take 40 mg by mouth daily., Until Discontinued, Historical Med    ranitidine (ZANTAC) 150 MG tablet Take 150 mg by mouth 2 (two) times daily as needed for heartburn., Until Discontinued, Historical Med    sulfaSALAzine (AZULFIDINE) 500 MG tablet Take 1,000 mg by mouth 2 (two) times daily., Until Discontinued, Historical Med    VITAMIN E PO Take 1 capsule by mouth 2 (two) times daily., Until Discontinued, Historical Med         DISCHARGE INSTRUCTIONS:   DIET:  Cardiac diet and Diabetic diet  DISCHARGE CONDITION:  Stable  ACTIVITY:  Activity as tolerated  OXYGEN:  Home Oxygen: No.   Oxygen Delivery: room air  DISCHARGE LOCATION:  home   If you experience worsening of your admission symptoms, develop shortness of breath, life threatening emergency, suicidal or homicidal thoughts you must seek medical attention immediately by calling 911 or calling your MD immediately  if symptoms less severe.  You Must read complete instructions/literature along with all the possible adverse reactions/side effects for all the Medicines you take and that have been prescribed to you. Take any new Medicines after you have completely understood and accpet all the possible adverse  reactions/side effects.   Please note  You were cared for by a hospitalist during your hospital stay. If you have any questions about your discharge medications or the care you received while you were in the hospital after you are discharged, you can call the unit and asked to speak with the hospitalist on call if the hospitalist that took care of you is not available. Once you are discharged, your primary care physician will handle any further medical issues. Please note that NO REFILLS for any discharge medications will be authorized once you are discharged, as it is imperative that you return to your primary care physician (or establish a relationship with a primary care physician if you do not have one) for your aftercare needs so that they can reassess your need for medications and monitor your lab values.     Today   No further slurred speech, numbness, tingling or weakness. No chest pain.  VITAL SIGNS:  Blood pressure 129/67, pulse 71, temperature 98.4 F (36.9 C), temperature source Oral, resp. rate 20, height 6\' 2"  (1.88 m), weight 85.73 kg (189 lb), SpO2 96 %.  I/O:   Intake/Output Summary (Last 24 hours) at 08/20/15 1631 Last data filed at 08/20/15 1300  Gross per 24 hour  Intake  480 ml  Output      0 ml  Net    480 ml    PHYSICAL EXAMINATION:  GENERAL:  69 y.o.-year-old patient lying in the bed with no acute distress.  EYES: Pupils equal, round, reactive to light and accommodation. No scleral icterus. Extraocular muscles intact.  HEENT: Head atraumatic, normocephalic. Oropharynx and nasopharynx clear.  NECK:  Supple, no jugular venous distention. No thyroid enlargement, no tenderness.  LUNGS: Normal breath sounds bilaterally, no wheezing, rales,rhonchi. No use of accessory muscles of respiration.  CARDIOVASCULAR: S1, S2 normal. No murmurs, rubs, or gallops.  ABDOMEN: Soft, non-tender, non-distended. Bowel sounds present. No organomegaly or mass.  EXTREMITIES: No  pedal edema, cyanosis, or clubbing.  NEUROLOGIC: Cranial nerves II through XII are intact. No focal motor or sensory defecits b/l.  PSYCHIATRIC: The patient is alert and oriented x 3. Good affect.  SKIN: No obvious rash, lesion, or ulcer.   DATA REVIEW:   CBC  Recent Labs Lab 08/19/15 1151  WBC 3.4*  HGB 13.0  HCT 38.8*  PLT 64*    Chemistries   Recent Labs Lab 08/19/15 1151  NA 137  K 3.9  CL 104  CO2 27  GLUCOSE 255*  BUN 17  CREATININE 0.73  CALCIUM 9.0  AST 55*  ALT 52  ALKPHOS 113  BILITOT 1.2    Cardiac Enzymes  Recent Labs Lab 08/19/15 1151  [place] <0.03    Microbiology Results  No results found for this or any previous visit.  RADIOLOGY:  Dg Chest 2 View  08/19/2015  CLINICAL DATA:  69 year old male with a several month history of shortness of breath and left-sided chest discomfort EXAM: CHEST  2 VIEW COMPARISON:  None. FINDINGS: The cardiac and mediastinal contours are within normal limits. Atherosclerotic calcifications are present within the thoracic aorta. No evidence of focal airspace consolidation, pleural effusion, pulmonary edema, pneumothorax or suspicious nodule. Age indeterminate but likely chronic fracture of left rib 7. Otherwise, no evidence of acute fracture or malalignment. IMPRESSION: 1. No acute cardiopulmonary process. 2. Aortic atherosclerosis. 3. Likely chronic fracture of left rib 7. Electronically Signed   By: Jacqulynn Cadet M.D.   On: 08/19/2015 12:54   Ct Head Wo Contrast  08/19/2015  CLINICAL DATA:  Slurred speech, right facial droop beginning Saturday night. EXAM: CT HEAD WITHOUT CONTRAST TECHNIQUE: Contiguous axial images were obtained from the base of the skull through the vertex without intravenous contrast. COMPARISON:  None. FINDINGS: No acute intracranial abnormality. Specifically, no hemorrhage, hydrocephalus, mass lesion, acute infarction, or significant intracranial injury. No acute calvarial abnormality.  Visualized paranasal sinuses and mastoids clear. Orbital soft tissues unremarkable. IMPRESSION: No acute intracranial abnormality. Electronically Signed   By: Rolm Baptise M.D.   On: 08/19/2015 12:48   Mr Brain Wo Contrast  08/20/2015  CLINICAL DATA:  Initial evaluation for right-sided facial droop with slurred speech 3 days ago, now resolved. EXAM: MRI HEAD WITHOUT CONTRAST MRA HEAD WITHOUT CONTRAST TECHNIQUE: Multiplanar, multiecho pulse sequences of the brain and surrounding structures were obtained without intravenous contrast. Angiographic images of the head were obtained using MRA technique without contrast. COMPARISON:  Prior CT from 08/19/2015. FINDINGS: MRI HEAD FINDINGS Mild diffuse prominence of the CSF containing spaces is compatible with generalized age-related cerebral atrophy. Patchy T2/FLAIR hyperintensity within the periventricular and deep white matter of both cerebral hemispheres is most consistent with chronic small vessel ischemic disease, mild for patient age. No abnormal foci of restricted diffusion to suggest acute intracranial infarct. Gray-white  matter differentiation maintained. Normal intravascular flow voids preserved. No acute or chronic intracranial hemorrhage. No areas of chronic infarction. No mass lesion, midline shift, or mass effect. No hydrocephalus. No extra-axial fluid collection. Craniocervical junction within normal limits. There is a tiny T1 hypo intense lesion within the pituitary gland (series 2, image 15), indeterminate. This appears T2 hyperintense on coronal T2 weighted sequence (series 11, image 19), measuring 6 bili. Pituitary stalk mildly deviated to the left. This lesion is indeterminate, not well evaluated on this non pituitary protocol MRI. No acute abnormality about the orbits. Mild mucosal thickening within the ethmoidal air cells. Paranasal sinuses are otherwise clear. Right mastoid effusion noted. Inner ear structures within normal limits. Bone marrow  signal intensity within normal limits. Scalp soft tissues demonstrate no acute abnormality. MRA HEAD FINDINGS ANTERIOR CIRCULATION: Visualized distal cervical segments of the internal carotid arteries are widely patent with antegrade flow. Petrous segments are widely patent. Cavernous and supraclinoid segments widely patent without stenosis or occlusion. There is mild focal outpouching of the cavernous right ICA without frank aneurysm (series 7, image 78). A1 segments, anterior communicating artery, and anterior cerebral arteries well opacified. M1 segments widely patent without stenosis or occlusion. MCA bifurcations normal. Distal MCA branches symmetric and well opacified bilaterally. POSTERIOR CIRCULATION: Vertebral arteries are patent to the vertebrobasilar junction. Posterior inferior cerebral arteries not well evaluated on this exam. Basilar artery widely patent. Superior cerebellar arteries patent bilaterally. Both posterior cerebral arteries arise from the basilar artery and are well opacified to their distal aspects. No aneurysm or vascular malformation. IMPRESSION: MRI HEAD IMPRESSION: 1. No acute intracranial infarct or other abnormality identified. 2. Mild age-related cerebral atrophy with chronic small vessel ischemic disease. 3. 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 While this finding is likely of doubtful clinical significance, a follow-up exam with pituitary protocol MRI, with and without contrast, is suggested for complete evaluation. MRA HEAD IMPRESSION: Negative intracranial MRA. Electronically Signed   By: Jeannine Boga M.D.   On: 08/20/2015 04:03   US Carotid Bilateral  08/20/2015  CLINICAL DATA:  TIA. History of hypertension, syncopal episode, hyperlipidemia, diabetes and smoking. EXAM: BILATERAL CAROTID DUPLEX ULTRASOUND TECHNIQUE: Pearline Cables scale imaging, color Doppler and duplex ultrasound were performed of bilateral  carotid and vertebral arteries in the neck. COMPARISON:  None. FINDINGS: Criteria: Quantification of carotid stenosis is based on velocity parameters that correlate the residual internal carotid diameter with NASCET-based stenosis levels, using the diameter of the distal internal carotid lumen as the denominator for stenosis measurement. The following velocity measurements were obtained: RIGHT ICA:  84/33 cm/sec CCA:  [id number] cm/sec SYSTOLIC ICA/CCA RATIO:  0.7 DIASTOLIC ICA/CCA RATIO:  1.6 ECA:  127 cm/sec LEFT ICA:  105/39 cm/sec CCA:  [id number] cm/sec SYSTOLIC ICA/CCA RATIO:  0.8 DIASTOLIC ICA/CCA RATIO:  2.1 ECA:  103 cm/sec RIGHT CAROTID ARTERY: There is a minimal amount of eccentric mixed echogenic plaque involving the origin proximal aspect the right internal carotid artery (image 28), not resulting in elevated peak systolic velocities within the interrogated course of the right internal carotid artery to suggest a hemodynamically significant stenosis. RIGHT VERTEBRAL ARTERY:  Antegrade the LEFT CAROTID ARTERY: There is a minimal amount of intimal thickening within the distal aspect of the left common carotid artery (image 49). There is a moderate amount of eccentric mixed echogenic plaque within the left carotid bulb (images 52 and 53), extending to involve the origin and proximal aspects of the  left internal carotid artery (image 60), not resulting in elevated peak systolic velocities within the interrogated course of the left internal carotid artery to suggest a hemodynamically significant stenosis. LEFT VERTEBRAL ARTERY:  Antegrade flow Incidental note is made of a cardiac arrhythmia. Note is made of a approximately 2.9 x 2.3 x 2.5 cm mixed echogenic partially cystic, predominantly solid nodule within the incidentally imaged left lobe of the thyroid (representative images 72 through 77). IMPRESSION: 1. Minimal to moderate amount of bilateral atherosclerotic plaque, left greater than right, not resulting in a  hemodynamically significant stenosis within either internal carotid artery. 2. Incidental note is made of a cardiac arrhythmia. Further evaluation with ECG monitoring could be performed as clinically indicated. 3. Incidentally noted approximately 2.9 cm indeterminate complex nodule within the left lobe of the thyroid. Further evaluation with dedicated carotid Doppler ultrasound could be performed as clinically indicated. Electronically Signed   By: Sandi Mariscal M.D.   On: 08/20/2015 11:49   Mr Jodene Nam Head/brain Wo Cm  08/20/2015  CLINICAL DATA:  Initial evaluation for right-sided facial droop with slurred speech 3 days ago, now resolved. EXAM: MRI HEAD WITHOUT CONTRAST MRA HEAD WITHOUT CONTRAST TECHNIQUE: Multiplanar, multiecho pulse sequences of the brain and surrounding structures were obtained without intravenous contrast. Angiographic images of the head were obtained using MRA technique without contrast. COMPARISON:  Prior CT from 08/19/2015. FINDINGS: MRI HEAD FINDINGS Mild diffuse prominence of the CSF containing spaces is compatible with generalized age-related cerebral atrophy. Patchy T2/FLAIR hyperintensity within the periventricular and deep white matter of both cerebral hemispheres is most consistent with chronic small vessel ischemic disease, mild for patient age. No abnormal foci of restricted diffusion to suggest acute intracranial infarct. Gray-white matter differentiation maintained. Normal intravascular flow voids preserved. No acute or chronic intracranial hemorrhage. No areas of chronic infarction. No mass lesion, midline shift, or mass effect. No hydrocephalus. No extra-axial fluid collection. Craniocervical junction within normal limits. There is a tiny T1 hypo intense lesion within the pituitary gland (series 2, image 15), indeterminate. This appears T2 hyperintense on coronal T2 weighted sequence (series 11, image 19), measuring 6 bili. Pituitary stalk mildly deviated to the left. This lesion  is indeterminate, not well evaluated on this non pituitary protocol MRI. No acute abnormality about the orbits. Mild mucosal thickening within the ethmoidal air cells. Paranasal sinuses are otherwise clear. Right mastoid effusion noted. Inner ear structures within normal limits. Bone marrow signal intensity within normal limits. Scalp soft tissues demonstrate no acute abnormality. MRA HEAD FINDINGS ANTERIOR CIRCULATION: Visualized distal cervical segments of the internal carotid arteries are widely patent with antegrade flow. Petrous segments are widely patent. Cavernous and supraclinoid segments widely patent without stenosis or occlusion. There is mild focal outpouching of the cavernous right ICA without frank aneurysm (series 7, image 78). A1 segments, anterior communicating artery, and anterior cerebral arteries well opacified. M1 segments widely patent without stenosis or occlusion. MCA bifurcations normal. Distal MCA branches symmetric and well opacified bilaterally. POSTERIOR CIRCULATION: Vertebral arteries are patent to the vertebrobasilar junction. Posterior inferior cerebral arteries not well evaluated on this exam. Basilar artery widely patent. Superior cerebellar arteries patent bilaterally. Both posterior cerebral arteries arise from the basilar artery and are well opacified to their distal aspects. No aneurysm or vascular malformation. IMPRESSION: MRI HEAD IMPRESSION: 1. No acute intracranial infarct or other abnormality identified. 2. Mild age-related cerebral atrophy with chronic small vessel ischemic disease. 3. 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 While this finding is likely of doubtful clinical significance, a follow-up exam with pituitary protocol MRI, with and without contrast, is suggested for complete evaluation. MRA HEAD IMPRESSION: Negative intracranial MRA. Electronically Signed   By: Jeannine Boga M.D.   On:  08/20/2015 04:03      Management plans discussed with the patient, family and they are in agreement.  CODE STATUS:     Code Status Orders        Start     Ordered   08/19/15 1602  Full code   Continuous     08/19/15 1601      TOTAL TIME TAKING CARE OF THIS PATIENT: 40 minutes.    Henreitta Leber M.D on 08/20/2015 at 4:31 PM  Between 7am to 6pm - Pager - [phone]  After 6pm go to [email or web address] - password [email or web address] [facility]  [facility]  Office  [phone]  CC: Primary care physician; No primary care provider on file.

## 2015-08-20 NOTE — Plan of Care (Signed)
Problem: Education: Goal: Knowledge of disease or condition will improve Outcome: Progressing Pt NIH=0, neuro cks reveal no deficits. Goal: Knowledge of patient specific risk factors addressed and post discharge goals established will improve Outcome: Progressing Pt shares understanding of risk factors for stroke.  I encouraged pt to get an automatic BP cuff to keep recordings of HR and BP.

## 2015-08-20 NOTE — Progress Notes (Signed)
Speech Therapy Note: received order, reviewed chart notes and met w/ pt/wife. Pt stated his s/s of slurred speech resolved prior to admission. Pt conversed at conversational level w/ SLP and wife w/ no language deficits noted. Pt also denied any swallowing problems w/ the regular diet consistency he receives; wife agreed. No skilled ST services indicated at this time. NSG and pt/wife will reconsult ST services if any questions or change in status. All agreed.  

## 2015-08-20 NOTE — Progress Notes (Signed)
OT Cancellation Note  Patient Details Name: Bernard Pennington MRN: [id number] DOB: 08/14/1946   Cancelled Treatment:     Order received for OT evaluation and chart reviewed.  Met briefly with pt and his wife for screening and no OT needs identified since patient feels he is at baseline with no residual deficits to assess.  Order completed.   Wofford,Susan    Susan Wofford, OTR/L  08/20/2015, 10:48 AM 

## 2015-08-20 NOTE — Evaluation (Signed)
Physical Therapy Evaluation Patient Details Name: Bernard Pennington MRN: [id number] DOB: 1946/09/19 Today's Date: 08/20/2015   History of Present Illness  Pt is a 69 y.o. male presenting 2 days after aphasia, slurred speech, and R sided facial droop (resolved).  PMH includes: recent MI, diabetes, htn.  Clinical Impression  Pt demonstrates steady independent functional mobility without AD.  Pt appears safe to discharge from hospital when medically appropriate.  No further PT needs identified during or after pt's hospital stay.  Will complete PT order.    Follow Up Recommendations No PT follow up    Equipment Recommendations  None recommended by PT    Recommendations for Other Services       Precautions / Restrictions Precautions Precaution Comments: moderate fall risk Restrictions Weight Bearing Restrictions: No      Mobility  Bed Mobility Overal bed mobility: Independent                Transfers Overall transfer level: Independent                  Ambulation/Gait Ambulation/Gait assistance: Independent Ambulation Distance (Feet): 250 Feet Assistive device: None Gait Pattern/deviations: WFL(Within Functional Limits)   Gait velocity interpretation: at or above normal speed for age/gender General Gait Details: steady without loss of balance  Stairs Stairs: Yes Stairs assistance: Modified independent (Device/Increase time) Stair Management: One rail Right;Alternating pattern;Forwards Number of Stairs: 6 General stair comments: steady without loss of balance  Wheelchair Mobility    Modified Rankin (Stroke Patients Only)       Balance Overall balance assessment: Independent (No loss of balance with ambulation and head turns R/L/up/down and increasing/decreasing speed)                                           Pertinent Vitals/Pain Pain Assessment: No/denies pain  Vitals stable and WFL throughout treatment session.    Home  Living Family/patient expects to be discharged to:: Private residence Living Arrangements: Spouse/significant other Available Help at Discharge: Family Type of Home: House Home Access: Stairs to enter Entrance Stairs-Rails: Psychiatric nurse of Steps: [place]: Laundry or work area in basement;Two level;Able to live on main level with bedroom/bathroom Home Equipment: None      Prior Function Level of Independence: Independent               Hand Dominance        Extremity/Trunk Assessment   Upper Extremity Assessment: Overall WFL for tasks assessed           Lower Extremity Assessment: Overall WFL for tasks assessed (Normal B LE proprioception and coordination with heel to shin testing; decreased sensation below knees B (h/o perpheral neuropathy))      Cervical / Trunk Assessment: Normal  Communication   Communication: No difficulties  Cognition Arousal/Alertness: Awake/alert Behavior During Therapy: WFL for tasks assessed/performed Overall Cognitive Status: Within Functional Limits for tasks assessed                      General Comments General comments (skin integrity, edema, etc.): 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  Pt agreeable to PT session.     Exercises        Assessment/Plan    PT Assessment Patent does not need any further PT services  PT Diagnosis     PT Problem List    PT Treatment Interventions     PT Goals (Current goals can be found in the Care Plan section) Acute Rehab PT Goals Patient Stated Goal: to leave hospital today PT Goal Formulation: With patient Time For Goal Achievement: 09/03/15 Potential to Achieve Goals: Good    Frequency     Barriers to discharge        Co-evaluation               End of Session Equipment Utilized During Treatment: Gait belt Activity Tolerance: Patient tolerated treatment  well Patient left: in chair;with call bell/phone within reach;with family/visitor present (pt moderate fall risk) Nurse Communication: Mobility status    Functional Assessment Tool Used: AM-PAC Functional Limitation: Mobility: Walking and moving around Mobility: Walking and Moving Around Current Status [phone]): 0 percent impaired, limited or restricted Mobility: Walking and Moving Around Goal Status [phone]): 0 percent impaired, limited or restricted Mobility: Walking and Moving Around Discharge Status [phone]): 0 percent impaired, limited or restricted    Time: 1004-1018 PT Time Calculation (min) (ACUTE ONLY): 14 min   Charges:         PT G Codes:   PT G-Codes **NOT FOR INPATIENT CLASS** Functional Assessment Tool Used: AM-PAC Functional Limitation: Mobility: Walking and moving around Mobility: Walking and Moving Around Current Status [id number]): 0 percent impaired, limited or restricted Mobility: Walking and Moving Around Goal Status [id number]): 0 percent impaired, limited or restricted Mobility: Walking and Moving Around Discharge Status [id number]): 0 percent impaired, limited or restricted    [facility] 08/20/2015, 11:44 AM Leitha Bleak, PT [phone]

## 2015-08-20 NOTE — Care Management (Signed)
Admitted to [facility] with the diagnosis of TIA. Lives with wife, Jackelyn Poling, [phone]) in [place].  Last seen Dr. Sonia Baller in [place] 4 months ago. Visiting daughter here and started experiencing chest discomfort. Takes care of all basic and instrumental activities of daily living himself. Retired from [employer]. Family will transport. Possible discharge today.  Shelbie Ammons RN MSN CCM Care Management [phone]

## 2015-12-13 ENCOUNTER — Inpatient Hospital Stay (EMERGENCY_DEPARTMENT_HOSPITAL)
Admission: EM | Admit: 2015-12-13 | Discharge: 2015-12-13 | Disposition: A | Discharge status: Home / Self Care | Payer: Medicare Other

## 2015-12-13 DIAGNOSIS — R109 Unspecified abdominal pain: Secondary | ICD-10-CM

## 2016-02-13 ENCOUNTER — Inpatient Hospital Stay (EMERGENCY_DEPARTMENT_HOSPITAL)
Admission: EM | Admit: 2016-02-13 | Discharge: 2016-02-13 | Disposition: A | Discharge status: Home / Self Care | Payer: Medicare Other

## 2016-02-13 DIAGNOSIS — R079 Chest pain, unspecified: Secondary | ICD-10-CM

## 2016-06-01 ENCOUNTER — Inpatient Hospital Stay (EMERGENCY_DEPARTMENT_HOSPITAL)
Admission: EM | Admit: 2016-06-01 | Discharge: 2016-06-01 | Disposition: A | Discharge status: Home / Self Care | Payer: Medicare Other

## 2016-06-01 DIAGNOSIS — E875 Hyperkalemia: Secondary | ICD-10-CM

## 2016-06-01 DIAGNOSIS — R1084 Generalized abdominal pain: Secondary | ICD-10-CM

## 2016-06-01 DIAGNOSIS — R112 Nausea with vomiting, unspecified: Secondary | ICD-10-CM

## 2016-06-04 ENCOUNTER — Other Ambulatory Visit: Payer: Self-pay

## 2016-06-05 LAB — HISTORICAL SURGICAL PATHOLOGY SPECIMEN

## 2016-08-12 ENCOUNTER — Encounter (INDEPENDENT_AMBULATORY_CARE_PROVIDER_SITE_OTHER): Payer: Self-pay | Admitting: Family

## 2016-08-12 DIAGNOSIS — R002 Palpitations: Secondary | ICD-10-CM | POA: Insufficient documentation

## 2016-08-12 DIAGNOSIS — I10 Essential (primary) hypertension: Secondary | ICD-10-CM | POA: Insufficient documentation

## 2016-08-12 DIAGNOSIS — E782 Mixed hyperlipidemia: Secondary | ICD-10-CM

## 2016-08-12 DIAGNOSIS — I4892 Unspecified atrial flutter: Secondary | ICD-10-CM | POA: Insufficient documentation

## 2016-08-12 DIAGNOSIS — E119 Type 2 diabetes mellitus without complications: Secondary | ICD-10-CM

## 2016-08-13 ENCOUNTER — Ambulatory Visit (INDEPENDENT_AMBULATORY_CARE_PROVIDER_SITE_OTHER): Payer: Medicare Other | Admitting: Family

## 2016-08-13 ENCOUNTER — Encounter (INDEPENDENT_AMBULATORY_CARE_PROVIDER_SITE_OTHER): Payer: Self-pay | Admitting: Family

## 2016-08-13 VITALS — BP 92/48 | HR 60 | Resp 16 | Ht 73.0 in | Wt 190.0 lb

## 2016-08-13 DIAGNOSIS — I10 Essential (primary) hypertension: Secondary | ICD-10-CM

## 2016-08-13 DIAGNOSIS — I483 Typical atrial flutter: Secondary | ICD-10-CM

## 2016-08-13 DIAGNOSIS — E782 Mixed hyperlipidemia: Secondary | ICD-10-CM

## 2016-08-13 NOTE — Assessment & Plan Note (Addendum)
No symptoms of palpitations.  Remains in sinus rhythm.  Continues on Eliquis and sotalol.

## 2016-08-13 NOTE — Patient Instructions (Addendum)
What Is Atrial Flutter/Atrial Fibrillation?      The heart has its own electrical system. This system makes the signals that start each heartbeat. The heartbeat begins in1 of the2 upper chambers of the heart (atria). A problem can make the atria beat faster than normal. The atria may beat fast but still evenly. This problem is called atrial flutter. If the atria beat very fast and also unevenly, it is called atrial fibrillation (AFib).  Causes of Atrial Flutter and Atrial Fibrillation  Causes of these problems can include:   Previous heart attack   High blood pressure   Thyroid problems  In many cases, the cause is unknown.  When the Atria Beat Too Fast  The atria may beat fast only once in a while. This is called a paroxysmal heart rhythm problem. If they beat fast all the time, it is a chronic problem.  Atrial Flutter  With atrial flutter, electrical signals travel around and around inside the atria. These circling signals make the atria beat too fast:   Atrial flutter can cause symptoms similar to AFib. It can also lead to the even faster, uneven rhythms of AFib.  Atrial Fibrillation (AFib)  With AFib, cells in the atria send extra electrical signals. These extra signals make the atria beat very fast. They also beat unevenly:   The atria beat so fast and unevenly that they may quiver instead of contracting. If the atria dont contract, they dont move enough blood into the2 lower chambers of the heart (ventricles). This can cause you to feel dizzy or weak.   Blood that doesnt keep moving can pool and form clots in the atria. These clots can move into other parts of the body and cause serious problems such as a stroke.  Symptoms of Atrial Flutter and AFib  These symptoms include the following:   Palpitations (a fluttering, fast heartbeat)   Weakness or tiredness   Shortness of breath   Chest pain or tightness   Dizziness or lightheadedness   Fainting spells   Date Last Reviewed: 11/23/2012    2000-2017 [employer] [address], [place], [place] [id number]. All rights reserved. This information is not intended as a substitute for professional medical care. Always follow your healthcare professional's instructions.

## 2016-08-13 NOTE — Assessment & Plan Note (Signed)
He recently stop statin per PCP.  He does take fish oil.  Lipids are followed by PCP.

## 2016-08-13 NOTE — Progress Notes (Signed)
[facility]  [address]  [place] [id number]  [phone]    Date:   08/13/2016  Name: Ray Ellis  Age: 70 y.o.    Chief Complaint: Follow Up 3 Months       Ray Ellis is a 70 y.o. male who is brought in today for routine follow-up visit.    OBJECTIVE  History of Present Illness  Last office visit was 01/07/2016 with Dr. Ambrose FinlandGnegy to follow up on atrial flutter, hypertension, and hyperlipidemia.  At that time the patient was doing well with no symptoms of atrial flutter.  At that time his blood pressure was elevated and the clonidine was resumed.  The patient had reported no further falls and he had stopped drinking alcohol altogether.  Since that visit the patient continues to do well.  He denies palpitations or cardiac symptoms.  He continues on Eliquis and sotalol as ordered.    Past Medical History  Current Outpatient Prescriptions   Medication Sig    apixaban (ELIQUIS) 5 mg Oral Tablet Twice daily    ascorbic acid, vitamin C, (VITAMIN C) 500 mg Oral Tablet Twice daily    aspirin (ECOTRIN) 81 mg Oral Tablet, Delayed Release (E.C.) Once a day    GABAPENTIN ORAL 900 mg Three times a day    INSULIN NPH HUMAN ISOPHANE (NOVOLIN N SUBQ)     INSULIN REGULAR, HUMAN (NOVOLIN R INJ)     lisinopril (PRINIVIL) 20 mg Oral Tablet Once a day    metFORMIN (GLUCOPHAGE) 500 mg Oral Tablet Twice daily with food    multivitamin Oral Tablet Once a day    niacin (NIACOR) 500 mg Oral Tablet Twice daily    NIFEdipine (ADALAT CC) 30 mg Oral Tablet Sustained Release Once a day    OMEGA-3 FATTY ACIDS (FISH OIL CONCENTRATE ORAL) 1,000 mg Once a day    omeprazole (PRILOSEC) 20 mg Oral Capsule, Delayed Release(E.C.) Once a day    pravastatin (PRAVACHOL) 40 mg Oral Tablet Once a day    Sennosides (SENNA) 8.6 mg Oral Capsule Twice daily    sotalol (BETAPACE) 80 mg Oral Tablet Twice daily    sulfaSALAzine (AZULFIDINE) 500 mg Oral Tablet Twice daily    vitamin E 400 unit Oral  Capsule Once a day     No Known Allergies  Past Medical History:   Diagnosis Date    Diabetes mellitus, type 2 (HCC)     Hypercholesterolemia     Hypertension          Past Surgical History:   Procedure Laterality Date    HX CHOLECYSTECTOMY      HX KNEE REPLACMENT           Family Medical History     Problem Relation (Age of Onset)    Cancer Father    Heart Attack Mother            Social History     Social History    Marital status: Married     Spouse name: N/A    Number of children: N/A    Years of education: N/A     Social History Main Topics    Smoking status: Former Smoker    Smokeless tobacco: Never Used    Alcohol use No    Drug use: Not on file    Sexual activity: Not on file     Other Topics Concern    Not on file     Social History Narrative  Review of Systems   BP (!) 92/48 Comment: left   Pulse 60   Resp 16   Ht 1.854 m (6\' 1" )   Wt 86.2 kg (190 lb)   BMI 25.07 kg/m2  Constitutional: No fevers, chills, malaise or abnormal weight loss.    Respiratory: No cough, shortness of breath, wheezing  Cardiovascular: No chest pain, PND, orthopnea, palpitations, lower extremity edema.    Neurological: No headaches, weakness, paresthesias  Hematological:  No bleeding, spontaneous bruising.  All other systems reviewed and are negative.    PHYSICAL EXAM    Constitutional: is oriented to person, place, and time. Appears well-developed and well-nourished.   Respiratory: Lungs bilaterally clear to auscultation.   Cardiovascular: Heart sounds S1, S2.  Rate controlled, rhythm regular.  No murmurs or rubs.  Vascular: Pedal and posterior tibial pulses 2+.  No lower extremity edema.  Skin: Warm and dry. Color normal.   Back/Mus-Skel: Normal gait  Neuro/Physch: Judgement and affect appropriate.    ASSESSMENT AND PLAN   Problem List Items Addressed This Visit        Cardiovascular System    Atrial flutter, paroxysmal (HCC) - Primary     No symptoms of palpitations.  Remains in sinus rhythm.  Continues on  Eliquis and sotalol.         Relevant Orders    ECG w Interp ( TODAY In Office )    Mixed hyperlipidemia     He recently stop statin per PCP.  He does take fish oil.  Lipids are followed by PCP.         Hypertension, benign     Blood pressure is low today which he reports is normal for him.  He is asymptomatic.  Will monitor.               Orders Placed This Encounter    ECG w Interp ( TODAY In Office )       Return in about 6 months (around 02/10/2017) for Routine follow up with Dr. Ambrose FinlandGnegy.     Overall the patient is stable from a cardiac standpoint.  Has no symptoms of atrial flutter. He remains in sinus rhythm. His blood pressure is a little low today but he is asymptomatic therefore medications will not be changed.  The patient will follow up with Dr. Ambrose FinlandGnegy per routine or sooner if needed.  [place] plan to [place] to [place] in the spring and his next visit to this office may be his last.  He will follow afterwards with a cardiologist in [place].  He was advised to call the office with any change in condition or questions.  Tammy L Crookshanks, APRN    I have reviewed the above history and physical on findings and agree.  Jaci Lazieravid Ruthel Martine, MD  08/13/2016, 15:50

## 2016-08-13 NOTE — Procedures (Signed)
Sinus bradycardia at 47/min, QTc 417, PRI 172.  Full interpretation by cardiologist to follow.  Sinus bradycardia with early repolarization

## 2016-08-13 NOTE — Assessment & Plan Note (Signed)
Blood pressure is low today which he reports is normal for him.  He is asymptomatic.  Will monitor.

## 2016-08-17 ENCOUNTER — Encounter (HOSPITAL_BASED_OUTPATIENT_CLINIC_OR_DEPARTMENT_OTHER): Payer: Self-pay | Admitting: UROLOGY

## 2016-08-28 ENCOUNTER — Other Ambulatory Visit (HOSPITAL_BASED_OUTPATIENT_CLINIC_OR_DEPARTMENT_OTHER): Payer: Self-pay | Admitting: UROLOGY

## 2016-08-28 DIAGNOSIS — R350 Frequency of micturition: Secondary | ICD-10-CM

## 2016-09-02 ENCOUNTER — Encounter (HOSPITAL_BASED_OUTPATIENT_CLINIC_OR_DEPARTMENT_OTHER): Payer: Medicare Other | Admitting: UROLOGY

## 2016-09-02 ENCOUNTER — Encounter (HOSPITAL_BASED_OUTPATIENT_CLINIC_OR_DEPARTMENT_OTHER): Payer: Self-pay

## 2016-11-10 IMAGING — MR MR MRA HEAD W/O CM
1 series · 21 of 48 positions shown · non-contrast
Comparison: Prior CT from 08/19/2015.

CLINICAL DATA: Initial evaluation for right-sided facial droop with
slurred speech 3 days ago, now resolved.

EXAM:
MRI HEAD WITHOUT CONTRAST
MRA HEAD WITHOUT CONTRAST
TECHNIQUE: Multiplanar, multiecho pulse sequences of the brain and surrounding
structures were obtained without intravenous contrast. Angiographic
images of the head were obtained using MRA technique without
contrast.

[Series 7: TOF · axial · non-contrast · 0.7mm · 0.37mm/px · z∈[-58,+60]mm · 21 of 180 slices shown]
[im 1/180]
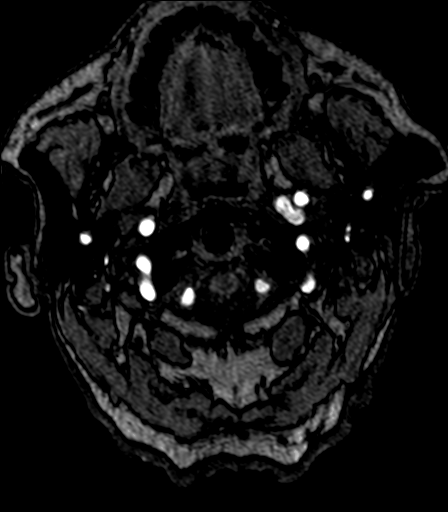
[im 4/180]
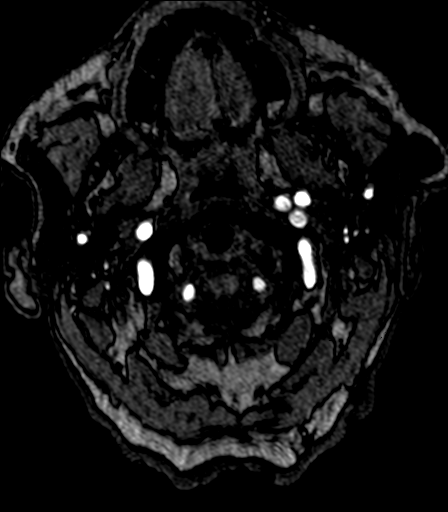
[im 8/180]
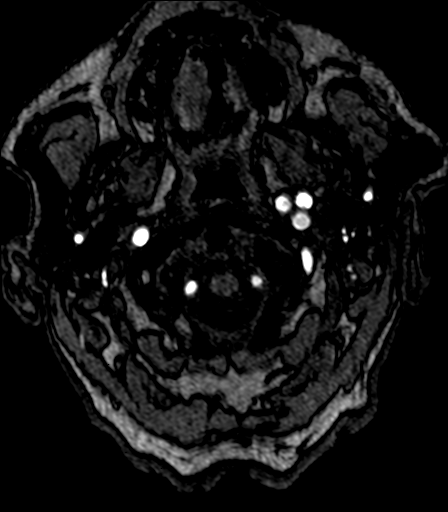
[im 12/180]
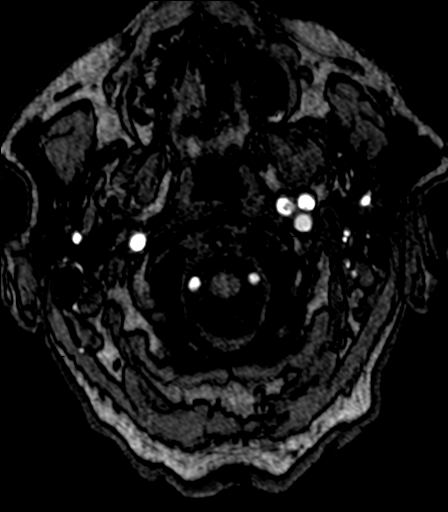
[im 16/180]
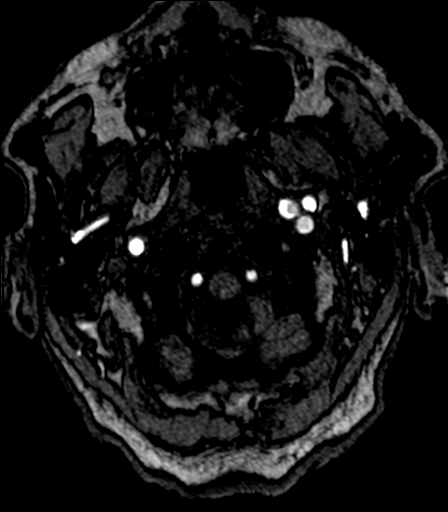
[im 20/180]
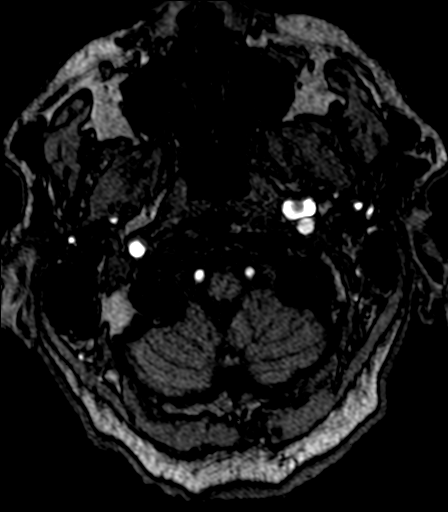
[im 23/180]
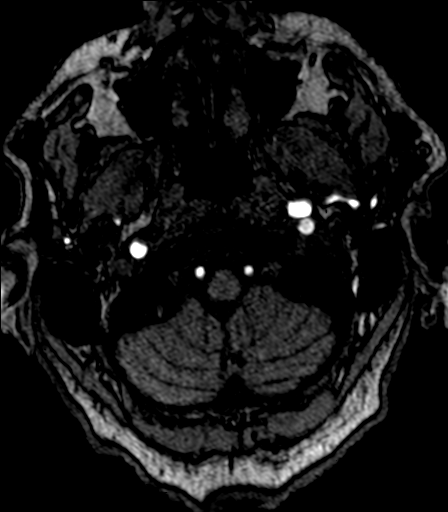
[im 27/180]
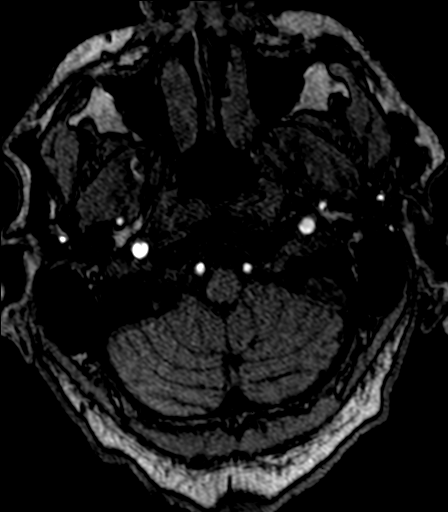
[im 31/180]
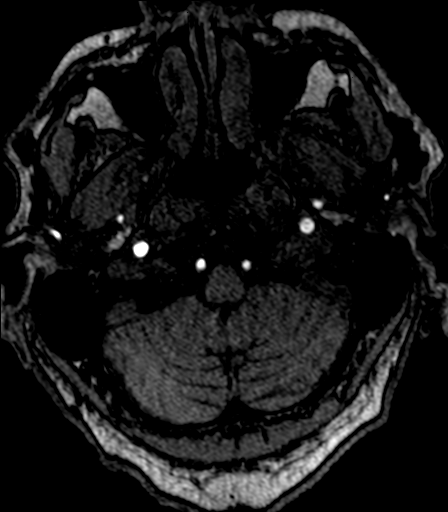
[im 35/180]
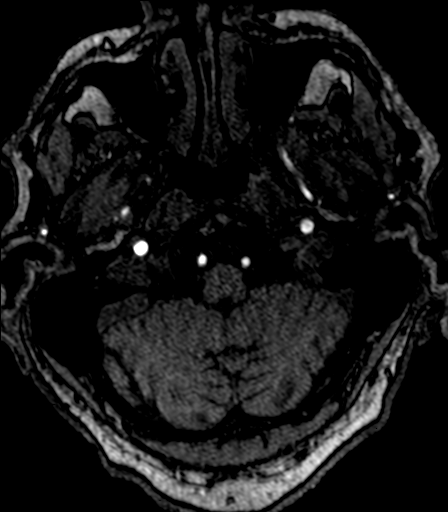
[im 39/180]
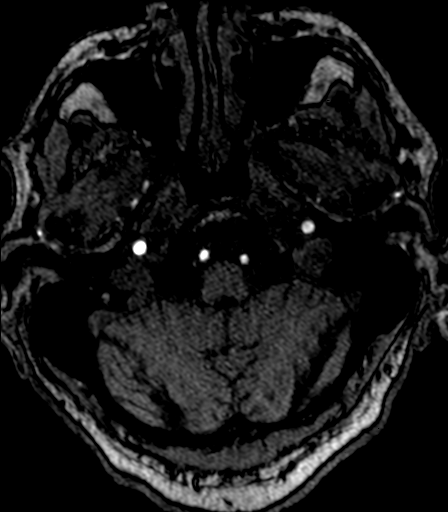
[im 42/180]
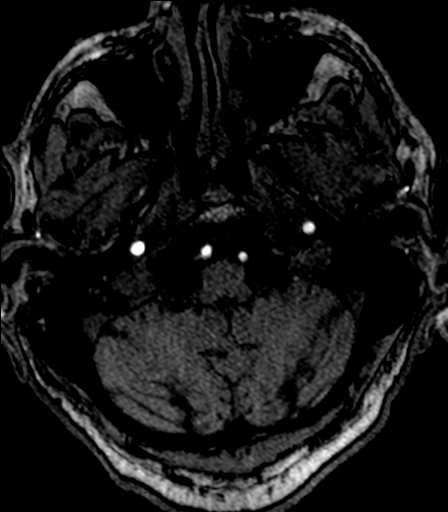
[im 46/180]
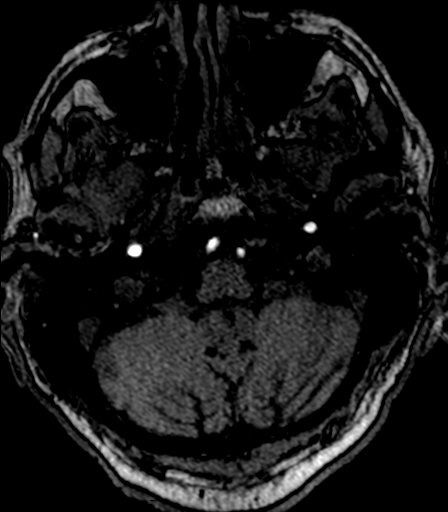
[im 58/180]
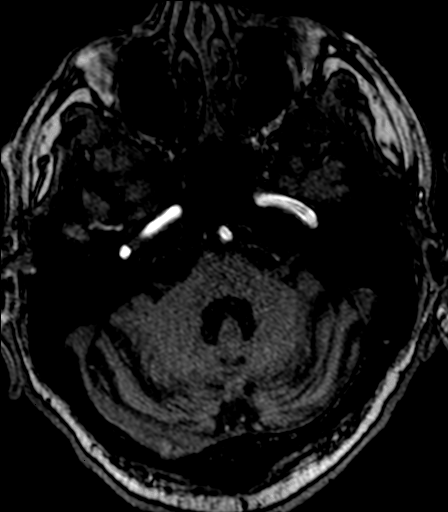
[im 80/180]
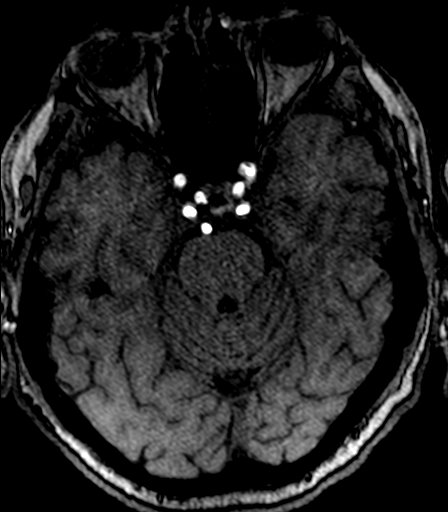
[im 92/180]
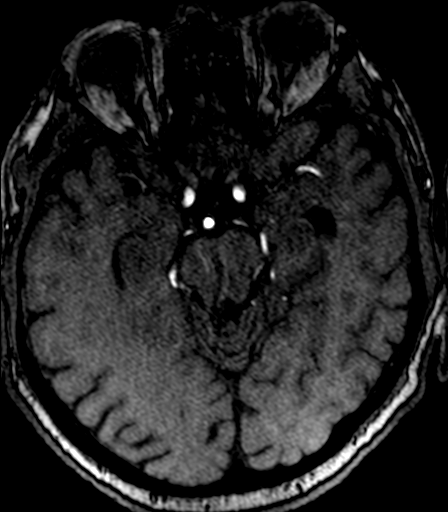
[im 103/180]
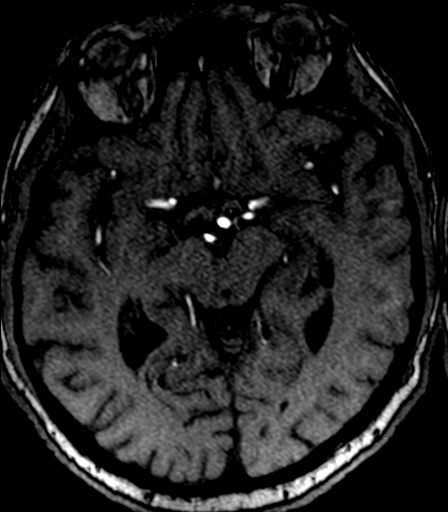
[im 126/180]
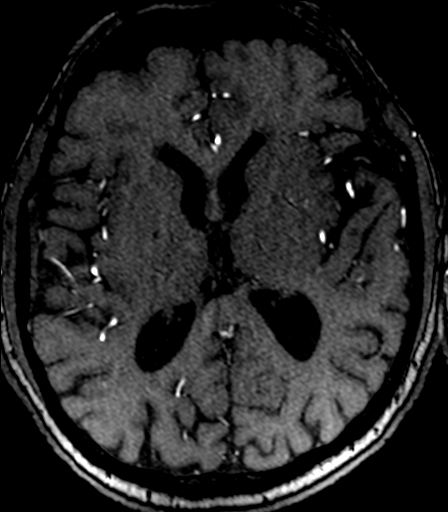
[im 149/180]
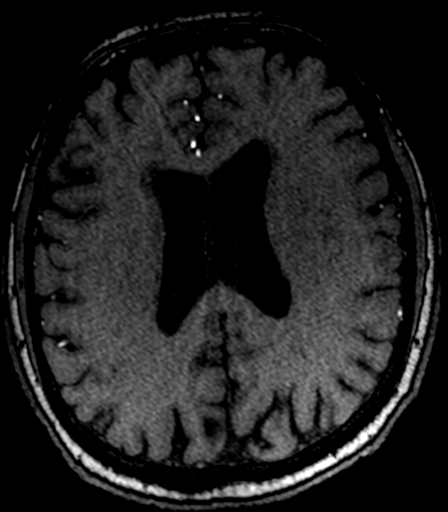
[im 153/180]
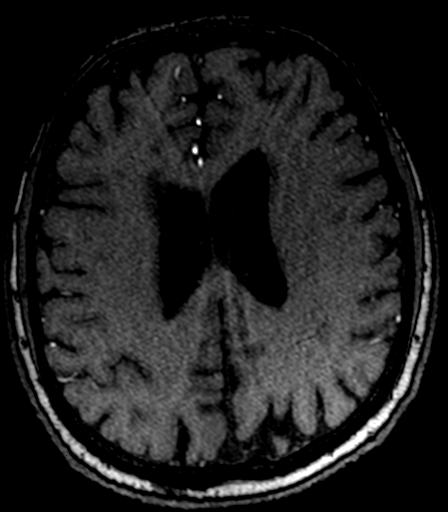
[im 172/180]
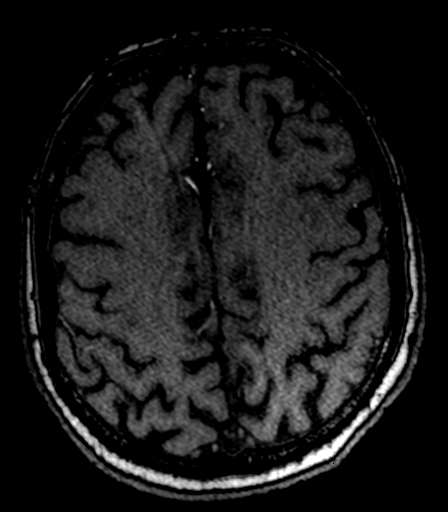

[21 of 48 positions shown; findings below may reference images not displayed]

FINDINGS: MRI HEAD FINDINGS

Mild diffuse prominence of the CSF containing spaces is compatible
with generalized age-related cerebral atrophy. Patchy T2/FLAIR
hyperintensity within the periventricular and deep white matter of
both cerebral hemispheres is most consistent with chronic small
vessel ischemic disease, mild for patient age.

No abnormal foci of restricted diffusion to suggest acute
intracranial infarct. Gray-white matter differentiation maintained.
Normal intravascular flow voids preserved. No acute or chronic
intracranial hemorrhage. No areas of chronic infarction.

No mass lesion, midline shift, or mass effect. No hydrocephalus. No
extra-axial fluid collection.

Craniocervical junction within normal limits.

There is a tiny T1 hypo intense lesion within the pituitary gland
(series 2, image 15), indeterminate. This appears T2 hyperintense on
coronal T2 weighted sequence (series 11, image 19), measuring 6
bili. Pituitary stalk mildly deviated to the left. This lesion is
indeterminate, not well evaluated on this non pituitary protocol
MRI.

No acute abnormality about the orbits.

Mild mucosal thickening within the ethmoidal air cells. Paranasal
sinuses are otherwise clear. Right mastoid effusion noted. Inner ear
structures within normal limits.

Bone marrow signal intensity within normal limits. Scalp soft
tissues demonstrate no acute abnormality.

MRA HEAD FINDINGS

ANTERIOR CIRCULATION:

Visualized distal cervical segments of the internal carotid arteries
are widely patent with antegrade flow. Petrous segments are widely
patent. Cavernous and supraclinoid segments widely patent without
stenosis or occlusion. There is mild focal outpouching of the
cavernous right ICA without frank aneurysm (series 7, image 78). A1
segments, anterior communicating artery, and anterior cerebral
arteries well opacified. M1 segments widely patent without stenosis
or occlusion. MCA bifurcations normal. Distal MCA branches symmetric
and well opacified bilaterally.

POSTERIOR CIRCULATION:

Vertebral arteries are patent to the vertebrobasilar junction.
Posterior inferior cerebral arteries not well evaluated on this
exam. Basilar artery widely patent. Superior cerebellar arteries
patent bilaterally. Both posterior cerebral arteries arise from the
basilar artery and are well opacified to their distal aspects.

No aneurysm or vascular malformation.
IMPRESSION: MRI HEAD IMPRESSION:

1. No acute intracranial infarct or other abnormality identified.
2. Mild age-related cerebral atrophy with chronic small vessel
ischemic disease.
3. 6 mm cystic lesion within the pituitary gland, not well evaluated
on this non pituitary protocol MRI. While this finding is likely of
doubtful clinical significance, a follow-up exam with pituitary
protocol MRI, with and without contrast, is suggested for complete
evaluation.

MRA HEAD IMPRESSION:

Negative intracranial MRA.

## 2016-11-10 IMAGING — CT CT HEAD W/O CM
1 series · 16 of 30 positions shown, 20 images · non-contrast
Comparison: None.

CLINICAL DATA: Slurred speech, right facial droop beginning
[REDACTED] night.

EXAM:
CT HEAD WITHOUT CONTRAST
TECHNIQUE: Contiguous axial images were obtained from the base of the skull
through the vertex without intravenous contrast.

[Series 2: head wo · axial · 0.43mm/px · z∈[+434,+568]mm · 16 of 31 slices shown, 20 images]
[im 2/31  brain]
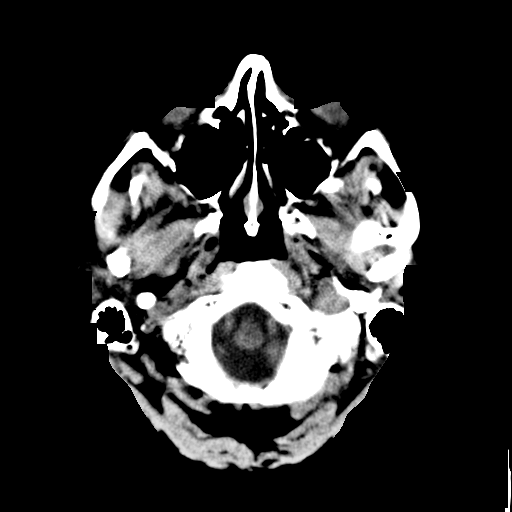
[im 2/31  bone]
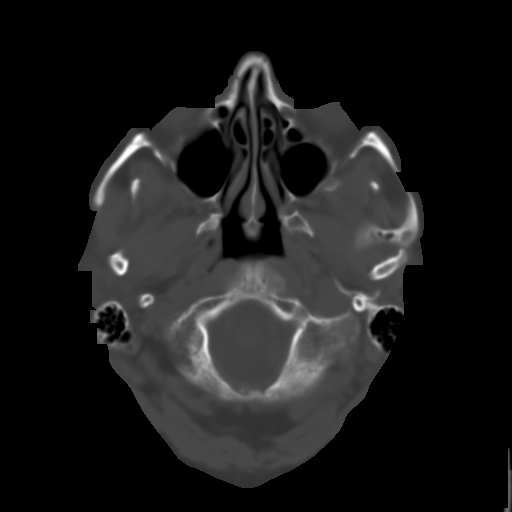
[im 4/31  brain]
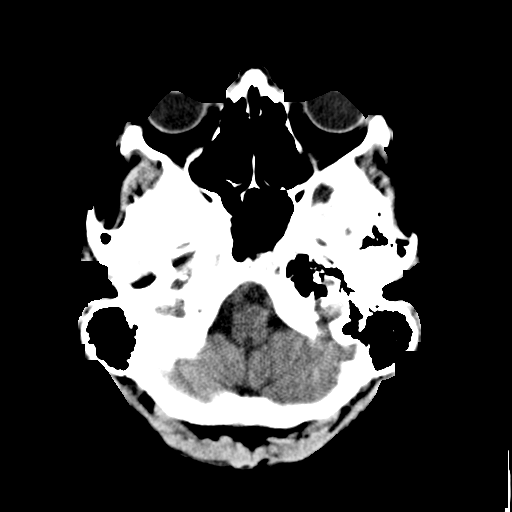
[im 6/31  brain]
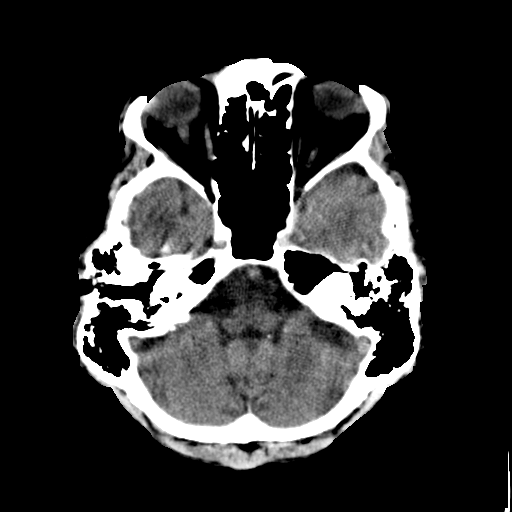
[im 8/31  brain]
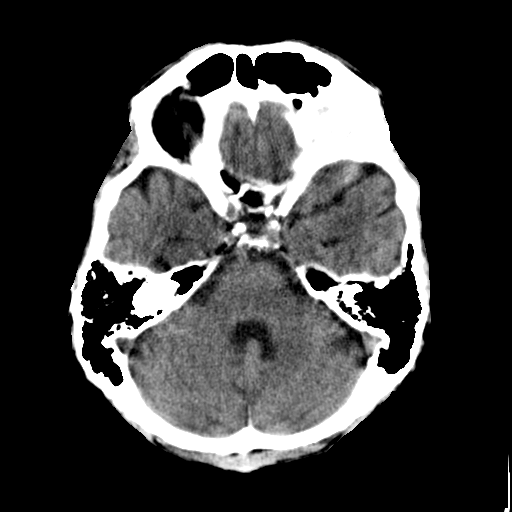
[im 9/31  brain]
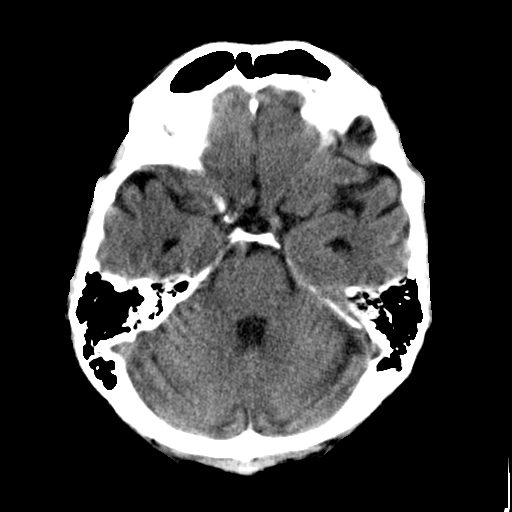
[im 9/31  bone]
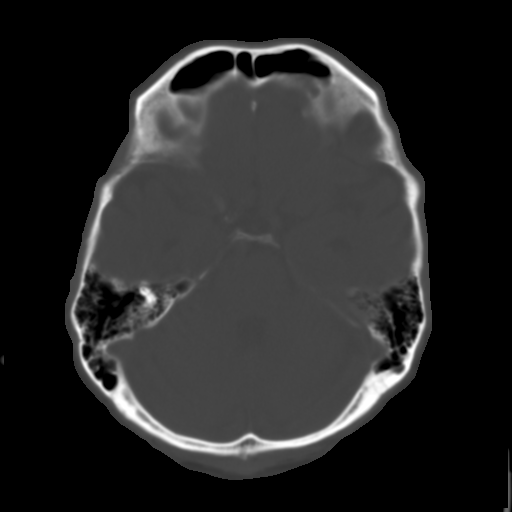
[im 11/31  brain]
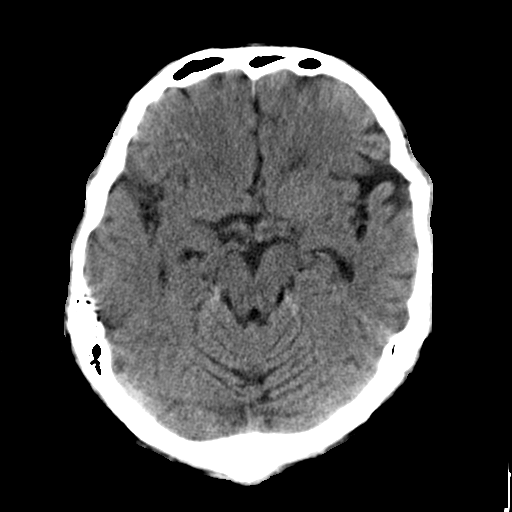
[im 13/31  brain]
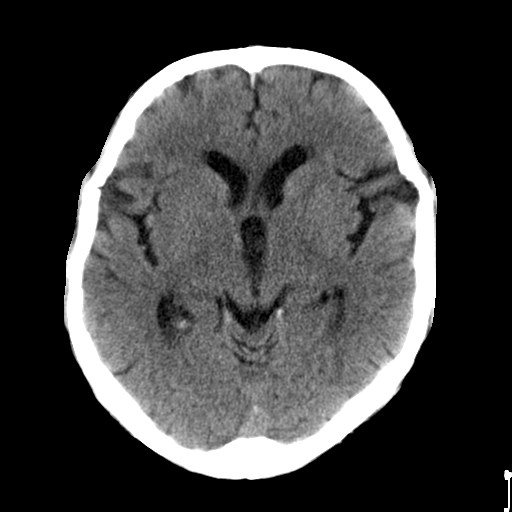
[im 15/31  brain]
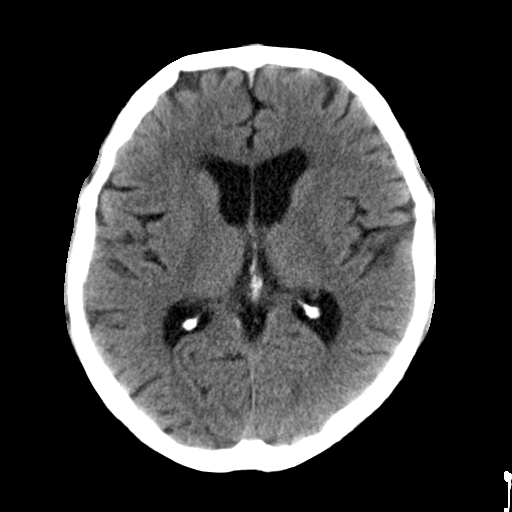
[im 16/31  brain]
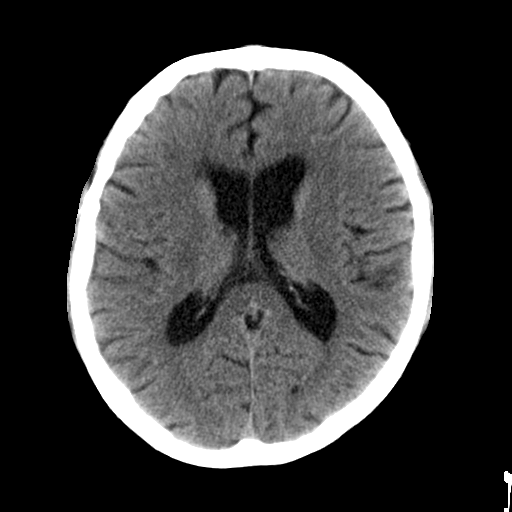
[im 16/31  bone]
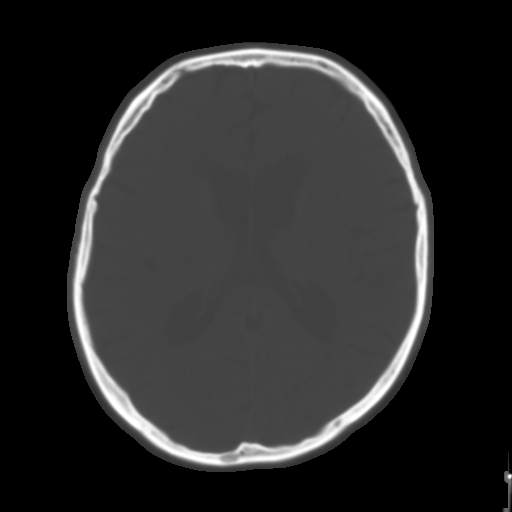
[im 18/31  brain]
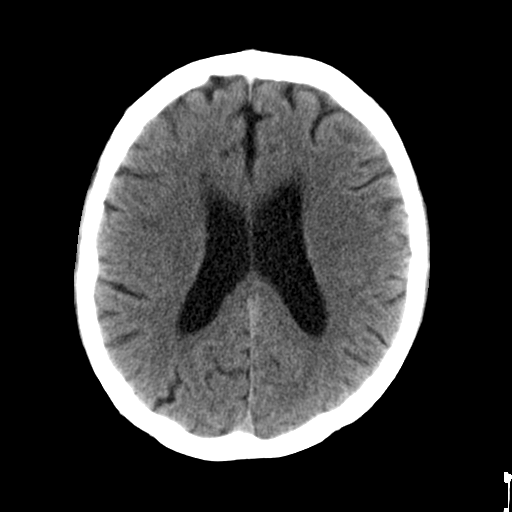
[im 20/31  brain]
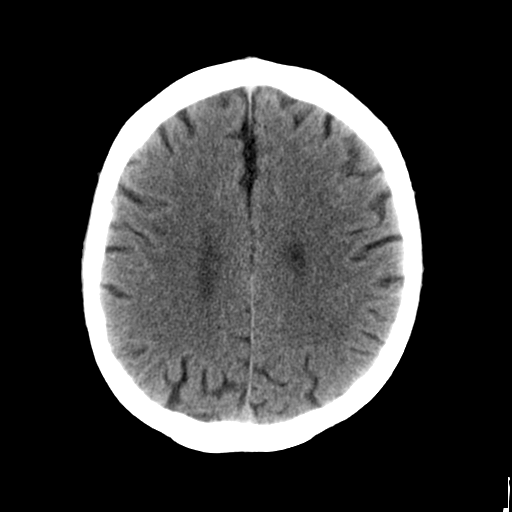
[im 22/31  brain]
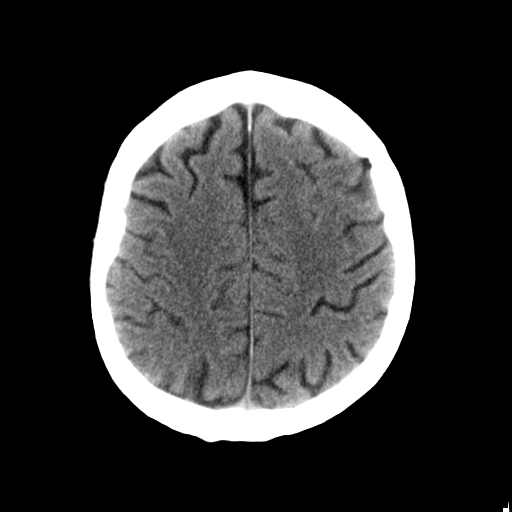
[im 23/31  brain]
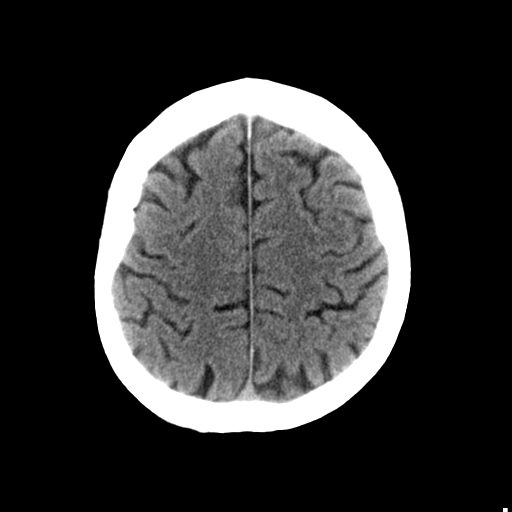
[im 23/31  bone]
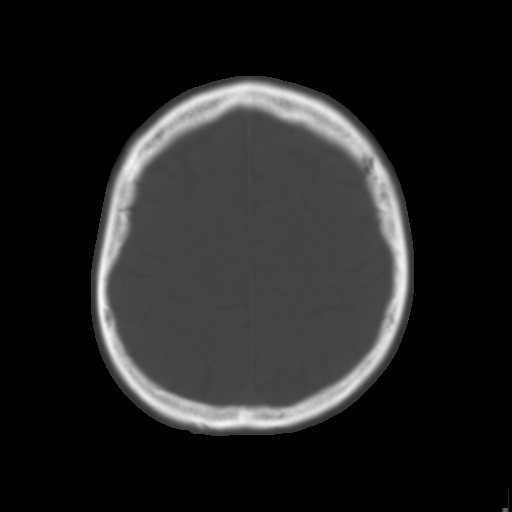
[im 25/31  brain]
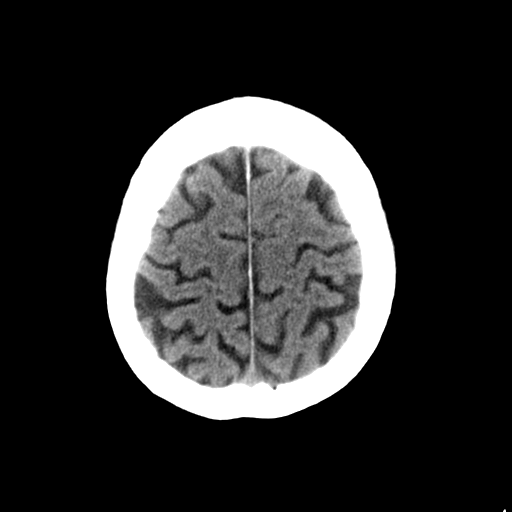
[im 27/31  brain]
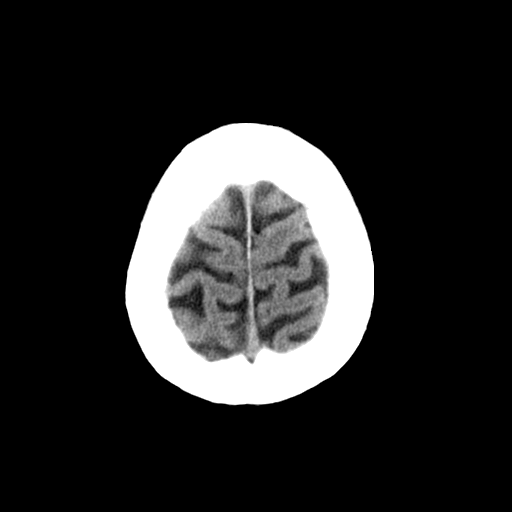
[im 29/31  brain]
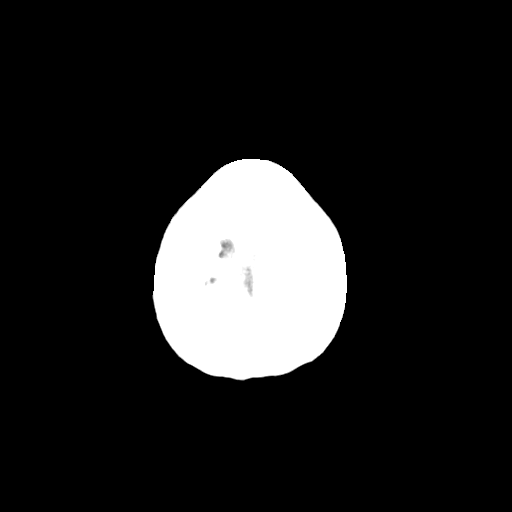

[16 of 30 positions shown; findings below may reference images not displayed]

FINDINGS: No acute intracranial abnormality. Specifically, no hemorrhage,
hydrocephalus, mass lesion, acute infarction, or significant
intracranial injury. No acute calvarial abnormality. Visualized
paranasal sinuses and mastoids clear. Orbital soft tissues
unremarkable.
IMPRESSION: No acute intracranial abnormality.

## 2016-11-10 IMAGING — CR DG CHEST 2V
2 series · 2 of 2 positions shown · non-contrast
Comparison: None.

CLINICAL DATA: 69-year-old male with a several month history of
shortness of breath and left-sided chest discomfort

EXAM:
CHEST  2 VIEW

[chest pa]
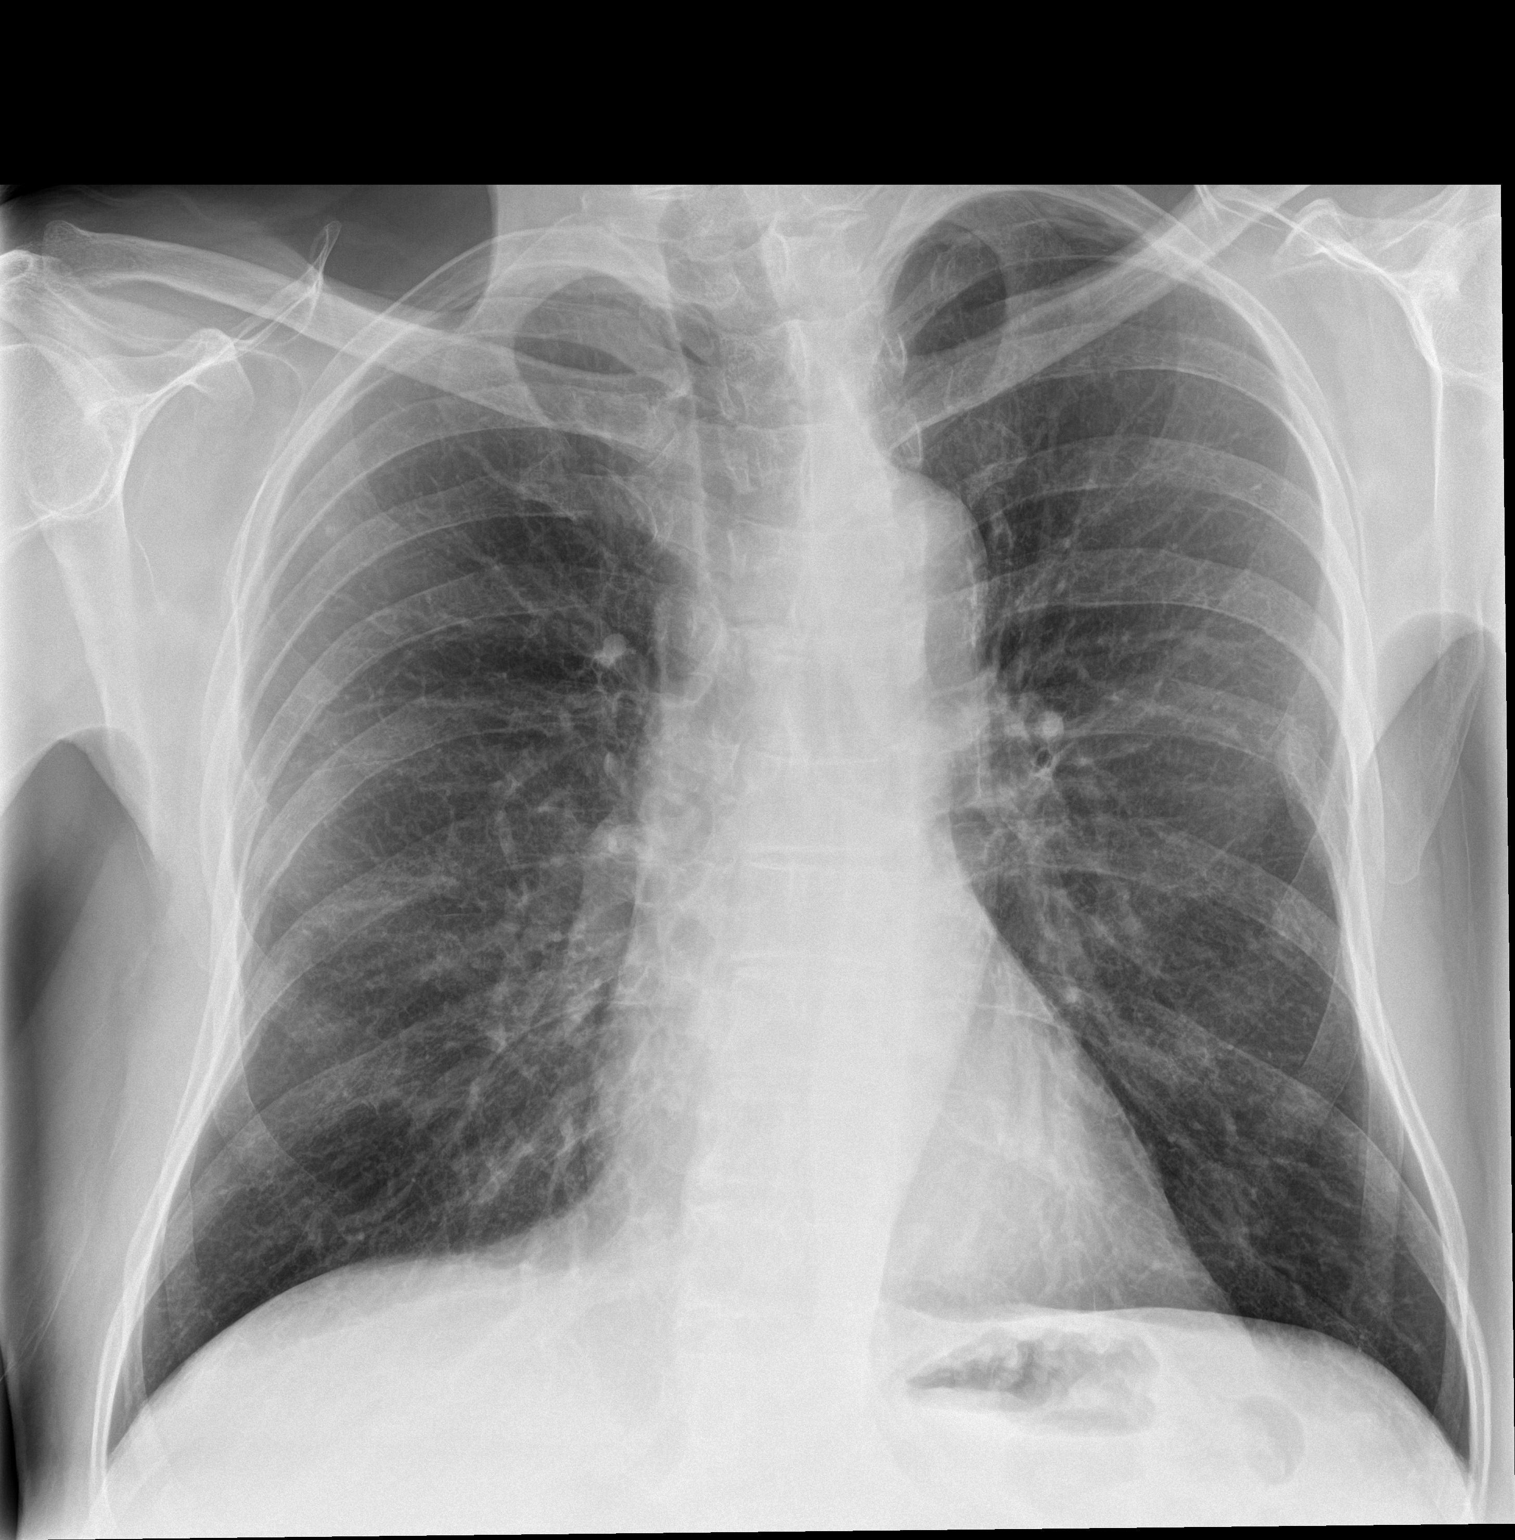

[chest lat]
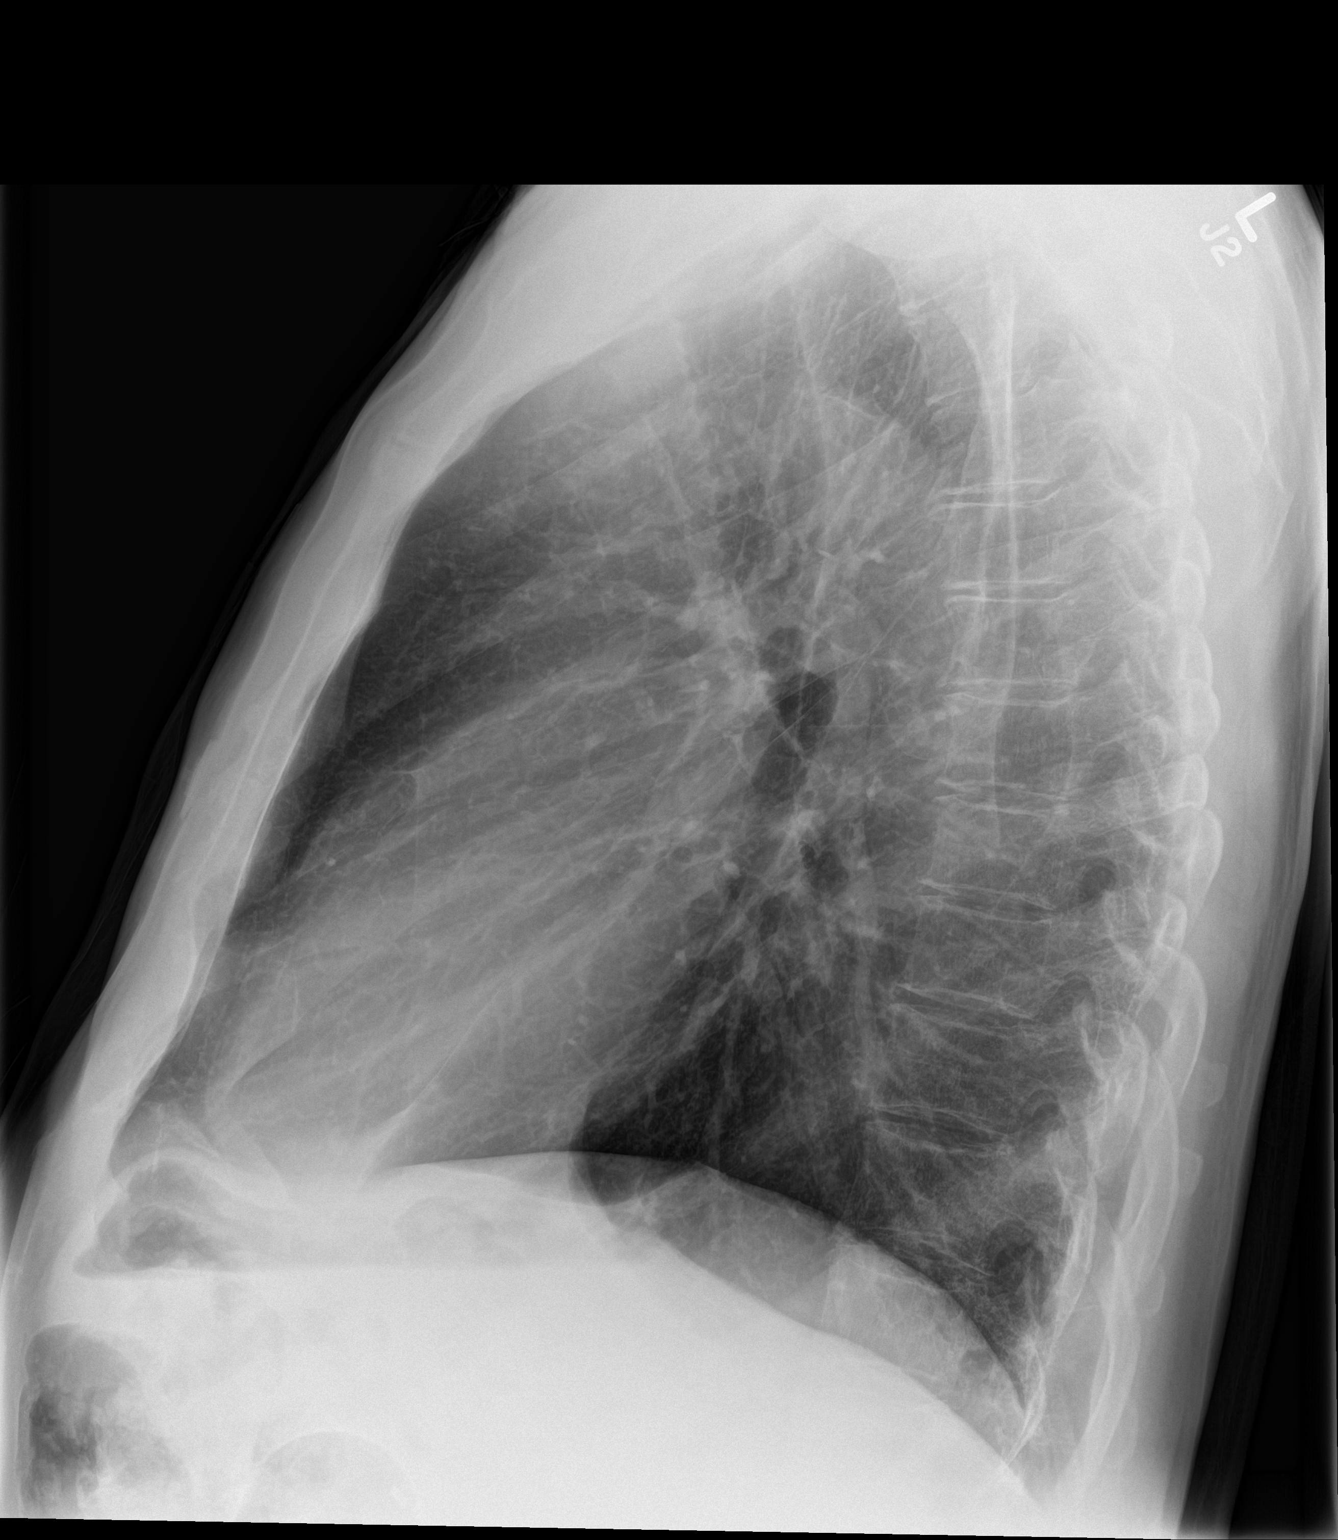

[2 of 2 positions shown; findings below may reference images not displayed]

FINDINGS: The cardiac and mediastinal contours are within normal limits.
Atherosclerotic calcifications are present within the thoracic
aorta. No evidence of focal airspace consolidation, pleural
effusion, pulmonary edema, pneumothorax or suspicious nodule. Age
indeterminate but likely chronic fracture of left rib 7. Otherwise,
no evidence of acute fracture or malalignment.
IMPRESSION: 1. No acute cardiopulmonary process.
2. Aortic atherosclerosis.
3. Likely chronic fracture of left rib 7.

## 2016-11-27 ENCOUNTER — Other Ambulatory Visit (INDEPENDENT_AMBULATORY_CARE_PROVIDER_SITE_OTHER): Payer: Self-pay | Admitting: Cardiovascular Disease

## 2016-11-30 MED ORDER — LISINOPRIL 20 MG TABLET
20.0000 mg | ORAL_TABLET | Freq: Every day | ORAL | 4 refills | Status: AC
Start: 2016-11-30 — End: 2017-02-28

## 2016-12-29 ENCOUNTER — Encounter (HOSPITAL_BASED_OUTPATIENT_CLINIC_OR_DEPARTMENT_OTHER): Payer: Self-pay | Admitting: Internal Medicine

## 2016-12-29 DIAGNOSIS — D61818 Other pancytopenia: Secondary | ICD-10-CM | POA: Insufficient documentation

## 2016-12-31 ENCOUNTER — Encounter (HOSPITAL_BASED_OUTPATIENT_CLINIC_OR_DEPARTMENT_OTHER): Payer: Medicare Other | Admitting: Internal Medicine

## 2017-01-26 ENCOUNTER — Encounter (INDEPENDENT_AMBULATORY_CARE_PROVIDER_SITE_OTHER): Payer: Medicare Other | Admitting: Cardiovascular Disease

## 2017-01-28 ENCOUNTER — Encounter (INDEPENDENT_AMBULATORY_CARE_PROVIDER_SITE_OTHER): Payer: Self-pay | Admitting: Cardiovascular Disease

## 2017-01-29 ENCOUNTER — Ambulatory Visit (INDEPENDENT_AMBULATORY_CARE_PROVIDER_SITE_OTHER): Payer: Medicare Other | Admitting: Cardiovascular Disease

## 2017-01-29 ENCOUNTER — Encounter (INDEPENDENT_AMBULATORY_CARE_PROVIDER_SITE_OTHER): Payer: Self-pay | Admitting: Cardiovascular Disease

## 2017-01-29 DIAGNOSIS — I2510 Atherosclerotic heart disease of native coronary artery without angina pectoris: Secondary | ICD-10-CM | POA: Insufficient documentation

## 2017-01-29 DIAGNOSIS — E7800 Pure hypercholesterolemia, unspecified: Principal | ICD-10-CM | POA: Insufficient documentation

## 2017-01-29 DIAGNOSIS — I10 Essential (primary) hypertension: Secondary | ICD-10-CM | POA: Insufficient documentation

## 2017-01-29 DIAGNOSIS — I481 Persistent atrial fibrillation: Secondary | ICD-10-CM

## 2017-01-29 DIAGNOSIS — K7460 Unspecified cirrhosis of liver: Secondary | ICD-10-CM | POA: Insufficient documentation

## 2017-01-29 DIAGNOSIS — E119 Type 2 diabetes mellitus without complications: Secondary | ICD-10-CM

## 2017-01-29 DIAGNOSIS — I4819 Other persistent atrial fibrillation: Secondary | ICD-10-CM

## 2017-01-29 MED ORDER — CLONIDINE HCL 0.1 MG TABLET: 0 mg | Tab | Freq: Two times a day (BID) | ORAL | 11 refills | 0 days | Status: AC

## 2017-01-29 NOTE — Procedures (Signed)
Sinus bradycardia otherwise within normal limits

## 2017-01-29 NOTE — Progress Notes (Signed)
[facility]  [address]  [place] [address]  [phone]      Date:   01/29/2017  Name: Ray Ellis  Age: 71 y.o.  PCP: Ray Hough, MD  [address]  [place] [address]      Chief Complaint: No chief complaint on file.    History of Present Illness  Ray Ellis is a 71 y.o. male who is brought in today for follow-up of his atrial fibrillation that he is on Betapace.  He reports not feeling well today beta blockers on mowing yesterday on riding tractor.  His blood pressure is low in the 80s and review of his medical regiment is different than what we have in the chart.  It appears that he is on 2 different Ace inhibitors nifedipine and clonidine 3 times a day.      Current Outpatient Prescriptions   Medication Sig   . apixaban (ELIQUIS) 5 mg Oral Tablet Twice daily   . ascorbic acid, vitamin C, (VITAMIN C) 500 mg Oral Tablet 1,000 mg Three times a day    . aspirin (ECOTRIN) 81 mg Oral Tablet, Delayed Release (E.C.) Once a day   . GABAPENTIN ORAL 900 mg Three times a day   . INSULIN NPH HUMAN ISOPHANE (NOVOLIN N SUBQ)    . INSULIN REGULAR, HUMAN (NOVOLIN R INJ)    . KLOR-CON 10 10 mEq Oral Tablet Sustained Release Take 1 Tab by mouth Once a day   . lisinopril (PRINIVIL) 20 mg Oral Tablet Take 1 Tab (20 mg total) by mouth Once a day for 90 days   . magnesium oxide (MAG-OX) 400 mg Oral Tablet Take 800 mg by mouth Twice daily   . metFORMIN (GLUCOPHAGE) 500 mg Oral Tablet Twice daily with food   . multivitamin Oral Tablet Once a day   . niacin (NIACOR) 500 mg Oral Tablet Twice daily   . omeprazole (PRILOSEC) 20 mg Oral Capsule, Delayed Release(E.C.) Once a day   . pravastatin (PRAVACHOL) 40 mg Oral Tablet Once a day   . raNITIdine (ZANTAC) 150 mg Oral Tablet Take 1 Tab by mouth Twice daily   . sotalol (BETAPACE) 80 mg Oral Tablet Twice daily   . sulfaSALAzine (AZULFIDINE) 500 mg Oral Tablet 1,000 mg Twice daily    . tamsulosin (FLOMAX) 0.4 mg  Oral Capsule, Sust. Release 24 hr    . vitamin E 400 unit Oral Capsule Once a day     No Known Allergies  Past Medical History:   Diagnosis Date   . Achalasia    . Benign prostatic hyperplasia with nocturia    . Cirrhosis (HCC)    . Cluster headaches    . Diabetes mellitus, type 2 (HCC)    . Diabetic neuropathy (HCC)    . Esophageal varices (HCC)    . Essential hypertension    . Frequent urination    . Gastritis    . Gastroparesis    . GERD (gastroesophageal reflux disease)    . Hypercholesterolemia    . Hypersplenism    . Inflammation of bladder    . Kidney stones    . Nephrolithiasis    . Persistent atrial fibrillation (HCC)    . Smoker    . Ulcerative colitis (HCC)    . Urinary disorder          Past Surgical History:   Procedure Laterality Date   . COLONOSCOPY     . HX CHOLECYSTECTOMY     .  HX KNEE REPLACMENT Left    . HX KNEE SURGERY      1/2 Lt Knee Replacement   . HX LITHOTRIPSY     . NOSE SURGERY     . URETEROSCOPY           Family Medical History     Problem Relation (Age of Onset)    Cancer Father    Heart Attack Mother            Social History     Social History   . Marital status: Married     Spouse name: N/A   . Number of children: N/A   . Years of education: N/A     Social History Main Topics   . Smoking status: Former Games developermoker   . Smokeless tobacco: Never Used   . Alcohol use No   . Drug use: Not on file   . Sexual activity: Not on file     Other Topics Concern   . Not on file     Social History Narrative       Review of Systems  Review of systems as forementioned above, otherwise 10 point review of systems was negative.  Physical Exam  BP (!) 84/50 Comment: right  Pulse 44  Resp 16  Ht 1.854 m (6\' 1" )  Wt 84.4 kg (186 lb)  BMI 24.54 kg/m2  GEN: alert and oriented x3, mood and affect normal, judgement intact  HEENT: at/nc  Neck: supple without mass  Lungs: Clear to auscultation and percussion  CV: neck veins flat, carotid upstrokes brisk, no lifts or thrills, pmi non-displaced, regular rate and  rhythm, normal S1/S2, no murmur or gallops  ABD: soft, non-tender, positive bowel sounds, no masses nor bruits  EXT: no clubbing, cyanosis, or edema, distal pulses intact  NEURO: grossly non-focal  SKIN: warm and dry, no rashes or lesions    Assessment and Plan:     ICD-10-CM    1. Hypercholesterolemia E78.00    2. Essential hypertension I10    3. Diabetes mellitus (HCC) E11.9    4. Cirrhosis (HCC) K74.60    5. Diabetes mellitus, type 2 (HCC) E11.9    6. Persistent atrial fibrillation (HCC) I48.1 ECG W INTERP (CLINIC ONLY) [facility]     This point in time I feel the patient has polypharmacy is contributing to his hypotension.  We will stop the nifedipine decrease the clonidine to twice a day and stop the captopril.  We will see him back in the office in 2 weeks for further follow-up.  Labs are also being drawn.      This note may have been partially generated using MModal Fluency Direct system, and there may be some incorrect words, spellings, and punctuation that were not noted in checking the note before saving, though effort was made to avoid such errors.  Jaci Lazieravid Cree Kunert, MD

## 2017-02-09 ENCOUNTER — Encounter (INDEPENDENT_AMBULATORY_CARE_PROVIDER_SITE_OTHER): Payer: Self-pay | Admitting: Family

## 2017-02-10 ENCOUNTER — Encounter (INDEPENDENT_AMBULATORY_CARE_PROVIDER_SITE_OTHER): Payer: Self-pay | Admitting: Family

## 2017-02-10 ENCOUNTER — Ambulatory Visit (INDEPENDENT_AMBULATORY_CARE_PROVIDER_SITE_OTHER): Payer: Medicare Other | Admitting: Family

## 2017-02-10 VITALS — BP 114/54 | HR 64 | Resp 16 | Ht 72.5 in | Wt 187.6 lb

## 2017-02-10 DIAGNOSIS — I4819 Other persistent atrial fibrillation: Secondary | ICD-10-CM

## 2017-02-10 DIAGNOSIS — I952 Hypotension due to drugs: Secondary | ICD-10-CM

## 2017-02-10 DIAGNOSIS — I10 Essential (primary) hypertension: Secondary | ICD-10-CM

## 2017-02-10 DIAGNOSIS — I481 Persistent atrial fibrillation: Secondary | ICD-10-CM

## 2017-02-10 LAB — ECG W INTERP (AMB USE ONLY)(MUSE,IN CLINIC)
Atrial Rate: 59 {beats}/min
Calculated P Axis: 60 degrees
Calculated R Axis: -3 degrees
Calculated T Axis: 38 degrees
PR Interval: 168 ms
QRS Duration: 78 ms
QT Interval: 442 ms
QTC Calculation: 437 ms
Ventricular rate: 59 {beats}/min

## 2017-02-10 NOTE — Progress Notes (Signed)
[facility]  [address]  [place] [address]  [phone]    Date:   02/10/2017  Name: Ray Ellis  Age: 71 y.o.    Chief Complaint: Medication follow up       Ray Ellis is a 71 y.o. male who is brought in today for follow-up on low blood pressure.      History of Present Illness  71 year old male patient with a history persistent atrial fibrillation currently on Eliquis and sotalol presented to the several weeks ago to Dr. Ambrose [place] weakness and low blood pressure.  That time he was found to be on 2 Ace inhibitors as well calcium channel blocker and clonidine 3 times a day.  He was advised to discontinue captopril and verapamil and reduce the clonidine to 2 times daily.  Since then he has been feeling much better and his blood pressure is not as low.  He denies symptoms of atrial fibrillation    Past Medical History  Current Outpatient Prescriptions   Medication Sig   . apixaban (ELIQUIS) 5 mg Oral Tablet Twice daily   . ascorbic acid, vitamin C, (VITAMIN C) 500 mg Oral Tablet 1,000 mg Three times a day    . aspirin (ECOTRIN) 81 mg Oral Tablet, Delayed Release (E.C.) Once a day   . cloNIDine HCl (CATAPRES) 0.1 mg Oral Tablet Take 1 Tab (0.1 mg total) by mouth Twice daily   . GABAPENTIN ORAL 900 mg Three times a day   . INSULIN NPH HUMAN ISOPHANE (NOVOLIN N SUBQ)    . INSULIN REGULAR, HUMAN (NOVOLIN R INJ)    . KLOR-CON 10 10 mEq Oral Tablet Sustained Release Take 1 Tab by mouth Once a day   . lisinopril (PRINIVIL) 20 mg Oral Tablet Take 1 Tab (20 mg total) by mouth Once a day for 90 days   . magnesium oxide (MAG-OX) 400 mg Oral Tablet Take 800 mg by mouth Twice daily   . metFORMIN (GLUCOPHAGE) 500 mg Oral Tablet Twice daily with food   . multivitamin Oral Tablet Once a day   . niacin (NIACOR) 500 mg Oral Tablet Twice daily   . omeprazole (PRILOSEC) 20 mg Oral Capsule, Delayed Release(E.C.) Once a day   . raNITIdine (ZANTAC) 150 mg Oral Tablet Take 1 Tab by mouth  Twice daily   . sotalol (BETAPACE) 80 mg Oral Tablet Twice daily   . sulfaSALAzine (AZULFIDINE) 500 mg Oral Tablet 1,000 mg Twice daily    . tamsulosin (FLOMAX) 0.4 mg Oral Capsule, Sust. Release 24 hr    . vitamin E 400 unit Oral Capsule Once a day     No Known Allergies  Past Medical History:   Diagnosis Date   . Achalasia    . Benign prostatic hyperplasia with nocturia    . Cirrhosis (HCC)    . Cluster headaches    . Diabetes mellitus, type 2 (HCC)    . Diabetic neuropathy (HCC)    . Esophageal varices (HCC)    . Essential hypertension    . Frequent urination    . Gastritis    . Gastroparesis    . GERD (gastroesophageal reflux disease)    . Hypercholesterolemia    . Hypersplenism    . Inflammation of bladder    . Kidney stones    . Nephrolithiasis    . Persistent atrial fibrillation (HCC)    . Smoker    . Ulcerative colitis (HCC)    . Urinary disorder  Past Surgical History:   Procedure Laterality Date   . COLONOSCOPY     . HX CHOLECYSTECTOMY     . HX KNEE REPLACMENT Left    . HX KNEE SURGERY      1/2 Lt Knee Replacement   . HX LITHOTRIPSY     . NOSE SURGERY     . URETEROSCOPY           Family Medical History     Problem Relation (Age of Onset)    Cancer Father    Heart Attack Mother            Social History     Social History   . Marital status: Married     Spouse name: N/A   . Number of children: N/A   . Years of education: N/A     Social History Main Topics   . Smoking status: Former Games developermoker   . Smokeless tobacco: Never Used   . Alcohol use No   . Drug use: Not on file   . Sexual activity: Not on file     Other Topics Concern   . Not on file     Social History Narrative       REVIEW OF SYSTEMS:   Constitutional: No fevers, chills, malaise or abnormal weight loss.    Eyes: No vision changes, drainage, pain  Respiratory: No cough, shortness of breath, wheezing  Cardiac:  No chest pain, palpitations or orthopnea.         Gastrointestinal: No abdominal pain, nausea, vomiting, or melena.  Endocrine: No  polyuria, polydipsia, heat or cold intolerance.  Genitourinary: No dysuria, urgency, frequency, hematuria, nocturia.  Skin: No rashes, suspicious lesions.  Neurological: No headaches, weakness, paresthesias  Hematological:  No bleeding, spontaneous bruising.  Psychiatric/Behavioral: No confusion, agitation, hallucinations, paranoia, delusions.    All other systems reviewed and are negative except as noted in history of presenting illness..    GENERAL EXAMINATION:     Vitals reviewed. BP (!) 114/54 Comment: RtA  Pulse 64  Resp 16  Ht 1.842 m (6' 0.5")  Wt 85.1 kg (187 lb 9.6 oz)  BMI 25.09 kg/m2    Constitutional: He is oriented to person, place, and time.  He appears well-developed and well-nourished.   HEENT:  Normocephalic, atraumatic.    Neck: Supple. Trachea midline.  Cardiovascular: Normal rate and rhythm without murmurs, rubs or gallops.  No bruits.  No peripheral edema is noted.  Pulmonary/Chest: Lungs clear without wheezes, rales or rhonchi.  No chest wall tenderness or deformity.   Abdominal: Soft. Non tender, non distended with normal bowel sounds in all quadrants.  Musculoskeletal: Full and painless ROM in all joint groups.  Neurological: Alert and oriented x 4.  Cranial nerves 2-12 intact without deficits.  BUE's/BLE's.  Strength and sensation intact in all extremities.   Skin: Skin is warm and dry.   Psychiatric:  Normal mood and affect. Speech is normal and behavior is normal. Judgment and thought content normal. Cognition and memory are normal.     There are no exam notes on file for this visit.    Assessment and Plan  ENCOUNTER DIAGNOSES     ICD-10-CM   1. Persistent atrial fibrillation (HCC) I48.1   2. Hypotension due to drugs I95.2   3. Essential hypertension I10       Problem List Items Addressed This Visit        Cardiovascular System    Essential hypertension    Persistent atrial  fibrillation (HCC) - Primary    Relevant Orders    EKG (today in clinic) (Completed)    Hypotension due to drugs         Orders Placed This Encounter   . EKG (today in clinic)    EKG shows sinus rhythm 59 beats per minute with a QTC 437 milliseconds and 1 PVC.  Full interpretation by cardiologist to follow.    The patient is much improved since the last appointment.  His blood pressure is better and he no longer feels as fatigued and weak.  No changes will be made to his medication regimen today.  Continues on sotalol and Eliquis for atrial fibrillation.  Also continues on lisinopril 20 mg and clonidine 0.1 mg b.i.d. for blood pressure.    Patient reports his PCP discontinued pravastatin due to lipids being too low.  He will bring lab work to this office for recommendations from Cardiology regarding the statin.    Patient is moving to [place] and may not be in town for his next appointment.      Return in about 6 months (around 08/13/2017) for Routine follow up with Dr. Ambrose [place].   The patient was advised to call this office with any concerns or change in condition.    Tammy L Crookshanks, APRN        This note may have been partially generated using MModal Fluency Direct system, and there may be some incorrect words, spellings, and punctuation that were not noted in checking the note before saving, though effort was made to avoid such errors.    I have reviewed the history and physical findings and agree with the above assessment and plan of nurse practitioner clinic note.    Jaci Lazier, MD  02/10/2017, 13:37

## 2017-03-09 ENCOUNTER — Emergency Department: Payer: Medicare Other

## 2017-03-09 ENCOUNTER — Emergency Department
Admission: EM | Admit: 2017-03-09 | Discharge: 2017-03-10 | Disposition: A | Discharge status: Home / Self Care | Payer: Medicare Other | Attending: Student in an Organized Health Care Education/Training Program | Admitting: Student in an Organized Health Care Education/Training Program

## 2017-03-09 DIAGNOSIS — I10 Essential (primary) hypertension: Secondary | ICD-10-CM | POA: Diagnosis not present

## 2017-03-09 DIAGNOSIS — Z87891 Personal history of nicotine dependence: Secondary | ICD-10-CM | POA: Diagnosis not present

## 2017-03-09 DIAGNOSIS — Z8673 Personal history of transient ischemic attack (TIA), and cerebral infarction without residual deficits: Secondary | ICD-10-CM | POA: Diagnosis not present

## 2017-03-09 DIAGNOSIS — K219 Gastro-esophageal reflux disease without esophagitis: Secondary | ICD-10-CM | POA: Diagnosis not present

## 2017-03-09 DIAGNOSIS — Z794 Long term (current) use of insulin: Secondary | ICD-10-CM | POA: Insufficient documentation

## 2017-03-09 DIAGNOSIS — Z79899 Other long term (current) drug therapy: Secondary | ICD-10-CM | POA: Diagnosis not present

## 2017-03-09 DIAGNOSIS — R112 Nausea with vomiting, unspecified: Secondary | ICD-10-CM

## 2017-03-09 DIAGNOSIS — R1013 Epigastric pain: Secondary | ICD-10-CM | POA: Diagnosis not present

## 2017-03-09 DIAGNOSIS — E119 Type 2 diabetes mellitus without complications: Secondary | ICD-10-CM | POA: Insufficient documentation

## 2017-03-09 LAB — COMPREHENSIVE METABOLIC PANEL
ALT: 39 U/L (ref 17–63)
ANION GAP: 8 (ref 5–15)
AST: 64 U/L — AB (ref 15–41)
Albumin: 3.2 g/dL — ABNORMAL LOW (ref 3.5–5.0)
Alkaline Phosphatase: 144 U/L — ABNORMAL HIGH (ref 38–126)
BILIRUBIN TOTAL: 1.2 mg/dL (ref 0.3–1.2)
BUN: 24 mg/dL — AB (ref 6–20)
CHLORIDE: 101 mmol/L (ref 101–111)
CO2: 27 mmol/L (ref 22–32)
Calcium: 9.3 mg/dL (ref 8.9–10.3)
Creatinine, Ser: 1.21 mg/dL (ref 0.61–1.24)
GFR, EST NON AFRICAN AMERICAN: 58 mL/min — AB (ref >60)
Glucose, Bld: 241 mg/dL — ABNORMAL HIGH (ref 65–99)
POTASSIUM: 5.1 mmol/L (ref 3.5–5.1)
Sodium: 136 mmol/L (ref 135–145)
TOTAL PROTEIN: 5.9 g/dL — AB (ref 6.5–8.1)

## 2017-03-09 LAB — CBC
HEMATOCRIT: 37.6 % — AB (ref 40.0–52.0)
HEMOGLOBIN: 12.7 g/dL — AB (ref 13.0–18.0)
MCH: 35.7 pg — ABNORMAL HIGH (ref 26.0–34.0)
MCHC: 33.9 g/dL (ref 32.0–36.0)
MCV: 105.3 fL — AB (ref 80.0–100.0)
Platelets: 89 10*3/uL — ABNORMAL LOW (ref 150–440)
RBC: 3.57 MIL/uL — ABNORMAL LOW (ref 4.40–5.90)
RDW: 14.1 % (ref 11.5–14.5)
WBC: 3.2 10*3/uL — AB (ref 3.8–10.6)

## 2017-03-09 LAB — URINALYSIS, COMPLETE (UACMP) WITH MICROSCOPIC
BILIRUBIN URINE: NEGATIVE
GLUCOSE, UA: NEGATIVE mg/dL
Hgb urine dipstick: NEGATIVE
Ketones, ur: NEGATIVE mg/dL
Leukocytes, UA: NEGATIVE
NITRITE: NEGATIVE
PH: 8 (ref 5.0–8.0)
PROTEIN: NEGATIVE mg/dL
SQUAMOUS EPITHELIAL / LPF: NONE SEEN
Specific Gravity, Urine: 1.026 (ref 1.005–1.030)

## 2017-03-09 LAB — TROPONIN I: Troponin I: <0.03 ng/mL (ref <0.03)

## 2017-03-09 LAB — LIPASE, BLOOD: LIPASE: 34 U/L (ref 11–51)

## 2017-03-09 MED ORDER — GI COCKTAIL ~~LOC~~
30.0000 mL | Freq: Once | ORAL | Status: AC
  Administered 2017-03-09: 30 mL via ORAL

## 2017-03-09 MED ORDER — ONDANSETRON HCL 4 MG/2ML IJ SOLN
4.0000 mg | Freq: Once | INTRAMUSCULAR | Status: AC
  Administered 2017-03-09: 4 mg via INTRAVENOUS
  Filled 2017-03-09: qty 2

## 2017-03-09 MED ORDER — METOCLOPRAMIDE HCL 5 MG/ML IJ SOLN
10.0000 mg | Freq: Once | INTRAMUSCULAR | Status: AC
  Administered 2017-03-09: 10 mg via INTRAVENOUS
  Filled 2017-03-09: qty 2

## 2017-03-09 MED ORDER — PANTOPRAZOLE SODIUM 40 MG PO TBEC
40.0000 mg | DELAYED_RELEASE_TABLET | Freq: Every day | ORAL | Status: DC
  Administered 2017-03-10: 40 mg via ORAL
  Filled 2017-03-09: qty 1

## 2017-03-09 MED ORDER — APIXABAN 5 MG PO TABS
5.0000 mg | ORAL_TABLET | Freq: Two times a day (BID) | ORAL | Status: DC
  Administered 2017-03-10: 5 mg via ORAL
  Filled 2017-03-09: qty 1

## 2017-03-09 MED ORDER — IOPAMIDOL (ISOVUE-300) INJECTION 61%
100.0000 mL | Freq: Once | INTRAVENOUS | Status: AC | PRN
  Administered 2017-03-09: 100 mL via INTRAVENOUS

## 2017-03-09 MED ORDER — FENTANYL CITRATE (PF) 100 MCG/2ML IJ SOLN
50.0000 ug | INTRAMUSCULAR | Status: DC | PRN
  Administered 2017-03-09: 50 ug via INTRAVENOUS
  Filled 2017-03-09: qty 2

## 2017-03-09 MED ORDER — SODIUM CHLORIDE 0.9 % IV BOLUS (SEPSIS)
500.0000 mL | Freq: Once | INTRAVENOUS | Status: AC
  Administered 2017-03-09: 500 mL via INTRAVENOUS

## 2017-03-09 MED ORDER — GI COCKTAIL ~~LOC~~
ORAL | Status: AC
  Administered 2017-03-09: 30 mL via ORAL
  Filled 2017-03-09: qty 30

## 2017-03-09 NOTE — ED Notes (Signed)
Patient transported to CT 

## 2017-03-09 NOTE — ED Notes (Signed)
Pt assisted to the restroom. Pt is able to ambulate with assist.

## 2017-03-09 NOTE — ED Triage Notes (Signed)
Pt reports that he started vomiting yesterday and has been unable to hold anything down including his medications - c/o generalized body aches and headache

## 2017-03-09 NOTE — ED Provider Notes (Signed)
[facility] Emergency Department Provider Note    First MD Initiated Contact with Patient 03/09/17 2111     (approximate)  I have reviewed the triage vital signs and the nursing notes.   HISTORY  Chief Complaint Emesis    HPI Bernard Pennington is a 71 y.o. male with a history of constipation, diabetes as well as gastric reflux presents with nausea vomiting and abdominal pain that started yesterday. States he's been unable to keep any medications down since yesterday. The chest pain or shortness of breath. Does have generalized body aches and muscle aches. Also had a headache yesterday. No measured fevers but the wife states that she also had a headache and GI symptoms over the weekend but hers only lasted about 12 hours. Patient is status post cholecystectomy but still has appendix. States that his emesis has been nonbloody nonbilious.   Past Medical History:  Diagnosis Date  . Constipation   . Diabetes mellitus without complication ([place])   . GERD (gastroesophageal reflux disease)   . Hyperlipemia   . Hypertension   . MI (myocardial infarction) ([place])    Family History  Problem Relation Age of Onset  . Heart attack Mother   . Stroke Father   . Cancer Father    History reviewed. No pertinent surgical history. Patient Active Problem List   Diagnosis Date Noted  . TIA (transient ischemic attack) 08/19/2015      Prior to Admission medications   Medication Sig Start Date End Date Taking? Authorizing Provider  Ascorbic Acid (VITAMIN C) 1000 MG tablet Take 1,000 mg by mouth 3 (three) times daily.    [provider]  captopril (CAPOTEN) 12.5 MG tablet Take 12.5 mg by mouth 2 (two) times daily.    [provider]  CINNAMON PO Take 1 tablet by mouth 3 (three) times daily before meals.    [provider]  cloNIDine (CATAPRES) 0.1 MG tablet Take 0.1 mg by mouth 3 (three) times daily.    [provider]  docusate  sodium (COLACE) 100 MG capsule Take 100 mg by mouth 2 (two) times daily.    [provider]  ferrous sulfate 325 (65 FE) MG tablet Take 325 mg by mouth 2 (two) times daily.    [provider]  gabapentin (NEURONTIN) 300 MG capsule Take 900 mg by mouth 3 (three) times daily.    [provider]  insulin NPH Human (HUMULIN N,NOVOLIN N) 100 UNIT/ML injection Inject 16 Units into the skin 2 (two) times daily.    [provider]  insulin regular (NOVOLIN R,HUMULIN R) 100 units/mL injection Inject 6-10 Units into the skin 4 (four) times daily. Pt uses per sliding scale.    [provider]  loperamide (IMODIUM) 2 MG capsule Take 2 mg by mouth as needed for diarrhea or loose stools.    [provider]  metFORMIN (GLUCOPHAGE) 500 MG tablet Take 500 mg by mouth 2 (two) times daily with a meal.    [provider]  Multiple Vitamin (MULTIVITAMIN WITH MINERALS) TABS tablet Take 1 tablet by mouth daily.    [provider]  niacin 500 MG tablet Take 1,000 mg by mouth 2 (two) times daily.    [provider]  Omega-3 Fatty Acids (FISH OIL) 1000 MG CAPS Take 1,000 mg by mouth daily.    [provider]  omeprazole (PRILOSEC) 20 MG capsule Take 40 mg by mouth daily.    [provider]  ondansetron [facility]  ODT) 4 MG disintegrating tablet Take 1 tablet (4 mg total) by mouth every 8 (eight) hours as needed for nausea or vomiting. 03/10/17   Merlyn Lot, MD  potassium chloride SA (K-DUR,KLOR-CON) 20 MEQ tablet Take 10 mEq by mouth daily.    [provider]  pravastatin (PRAVACHOL) 40 MG tablet Take 40 mg by mouth daily.    [provider]  ranitidine (ZANTAC) 150 MG tablet Take 150 mg by mouth 2 (two) times daily as needed for heartburn.    [provider]  sulfaSALAzine (AZULFIDINE) 500 MG tablet Take 1,000 mg by mouth 2 (two) times daily.    [provider]  verapamil (CALAN) 120 MG  tablet Take 1 tablet (120 mg total) by mouth 2 (two) times daily. 08/20/15   Henreitta Leber, MD  VITAMIN E PO Take 1 capsule by mouth 2 (two) times daily.    [provider]    Allergies Patient has no known allergies.    Social History Social History  Substance Use Topics  . Smoking status: Former Research scientist (life sciences)  . Smokeless tobacco: Never Used  . Alcohol use Yes    Review of Systems Patient denies headaches, rhinorrhea, blurry vision, numbness, shortness of breath, chest pain, edema, cough, abdominal pain, nausea, vomiting, diarrhea, dysuria, fevers, rashes or hallucinations unless otherwise stated above in HPI. ____________________________________________   PHYSICAL EXAM:  VITAL SIGNS: Vitals:   03/09/17 2200 03/09/17 2230  BP: (!) 170/94 (!) 152/83  Pulse:  64  Resp: 16 18  Temp:     Constitutional: Alert and oriented. Well appearing and in no acute distress. Eyes: Conjunctivae are normal.  Head: Atraumatic. Nose: No congestion/rhinnorhea. Mouth/Throat: Mucous membranes are moist.   Neck: No stridor. Painless ROM.  Cardiovascular: Normal rate, regular rhythm. Grossly normal heart sounds.  Good peripheral circulation. Respiratory: Normal respiratory effort.  No retractions. Lungs CTAB. Gastrointestinal: Soft and nontender. No distention. No abdominal bruits. No CVA tenderness Musculoskeletal: No lower extremity tenderness nor edema.  No joint effusions. Neurologic:  Normal speech and language. No gross focal neurologic deficits are appreciated. No facial droop Skin:  Skin is warm, dry and intact. No rash noted. Psychiatric: Mood and affect are normal. Speech and behavior are normal.  ____________________________________________   LABS (all labs ordered are listed, but only abnormal results are displayed)  Results for orders placed or performed during the hospital encounter of 03/09/17 (from the past 24 hour(s))  Lipase, blood     Status: None   Collection  Time: 03/09/17  6:18 PM  Result Value Ref Range   Lipase 34 11 - 51 U/L  Comprehensive metabolic panel     Status: Abnormal   Collection Time: 03/09/17  6:18 PM  Result Value Ref Range   Sodium 136 135 - 145 mmol/L   Potassium 5.1 3.5 - 5.1 mmol/L   Chloride 101 101 - 111 mmol/L   CO2 27 22 - 32 mmol/L   Glucose, Bld 241 (H) 65 - 99 mg/dL   BUN 24 (H) 6 - 20 mg/dL   Creatinine, Ser 1.21 0.61 - 1.24 mg/dL   Calcium 9.3 8.9 - 10.3 mg/dL   Total Protein 5.9 (L) 6.5 - 8.1 g/dL   Albumin 3.2 (L) 3.5 - 5.0 g/dL   AST 64 (H) 15 - 41 U/L   ALT 39 17 - 63 U/L   Alkaline Phosphatase 144 (H) 38 - 126 U/L   Total Bilirubin 1.2 0.3 - 1.2 mg/dL   GFR calc non Af  Amer 58 (L) >60 mL/min   GFR calc Af Amer >60 >60 mL/min   Anion gap 8 5 - 15  CBC     Status: Abnormal   Collection Time: 03/09/17  6:18 PM  Result Value Ref Range   WBC 3.2 (L) 3.8 - 10.6 K/uL   RBC 3.57 (L) 4.40 - 5.90 MIL/uL   Hemoglobin 12.7 (L) 13.0 - 18.0 g/dL   HCT 37.6 (L) 40.0 - 52.0 %   MCV 105.3 (H) 80.0 - 100.0 fL   MCH 35.7 (H) 26.0 - 34.0 pg   MCHC 33.9 32.0 - 36.0 g/dL   RDW 14.1 11.5 - 14.5 %   Platelets 89 (L) 150 - 440 K/uL  Troponin I     Status: None   Collection Time: 03/09/17  6:20 PM  Result Value Ref Range   Troponin I <0.03 <0.03 ng/mL  Urinalysis, Complete w Microscopic     Status: Abnormal   Collection Time: 03/09/17 10:00 PM  Result Value Ref Range   Color, Urine YELLOW (A) YELLOW   APPearance CLEAR (A) CLEAR   Specific Gravity, Urine 1.026 1.005 - 1.030   pH 8.0 5.0 - 8.0   Glucose, UA NEGATIVE NEGATIVE mg/dL   Hgb urine dipstick NEGATIVE NEGATIVE   Bilirubin Urine NEGATIVE NEGATIVE   Ketones, ur NEGATIVE NEGATIVE mg/dL   Protein, ur NEGATIVE NEGATIVE mg/dL   Nitrite NEGATIVE NEGATIVE   Leukocytes, UA NEGATIVE NEGATIVE   RBC / HPF 0-5 0 - 5 RBC/hpf   WBC, UA 0-5 0 - 5 WBC/hpf   Bacteria, UA RARE (A) NONE SEEN   Squamous Epithelial / LPF NONE SEEN NONE SEEN    ____________________________________________  EKG My review and personal interpretation at Time: 60   Indication: epigastric pain  Rate: 60  Rhythm: sinus Axis:normal,  normal intervals, no STEMI ____________________________________________  RADIOLOGY  I personally reviewed all radiographic images ordered to evaluate for the above acute complaints and reviewed radiology reports and findings.  These findings were personally discussed with the patient.  Please see medical record for radiology report.  ____________________________________________   PROCEDURES  Procedure(s) performed:  Procedures    Critical Care performed: no ____________________________________________   INITIAL IMPRESSION / ASSESSMENT AND PLAN / ED COURSE  Pertinent labs & imaging results that were available during my care of the patient were reviewed by me and considered in my medical decision making (see chart for details).  DDX: gastritis, pancreatitis, sbo, enteritis, dehydration, acs  Tristain Daily is a 71 y.o. who presents to the ED with nausea vomiting and epigastric pain as described above. Based on his abdominal pain with history of surgery and concern for obstruction and therefore will order CT imaging to further characterize. Patient will be provided IV fluids and IV antiemetics with pain medication. Since is consistent with ACS as a normal EKG and normal troponin. Blood work is roughly at baseline as compared with recent laboratory testing done at the [place] in [place]. Patient otherwise nontoxic appearing.  The patient will be placed on continuous pulse oximetry and telemetry for monitoring.  Laboratory evaluation will be sent to evaluate for the above complaints.     Clinical Course as of Mar 11 11  Tue Mar 09, 2017  2344 Patient was significant improvement in symptoms after GI cocktail. CT imaging results discussed with patient. Requesting something to drink at this time. Will trial  oral medication. Likely some component of gastritis or viral process received from his wife. Blood work otherwise  at baseline. If it were to tolerate oral hydration his medications a do feel that he be appropriate for follow-up as an outpatient.  [PR]  Wed Mar 10, 2017  0012 Patient tolerated her medications. States symptoms have resolved. I recommended admission hospital for further symptomatically treatment patient is requesting discharge home and I do think that that is reasonable option.  Have discussed with the patient and available family all diagnostics and treatments performed thus far and all questions were answered to the best of my ability. The patient demonstrates understanding and agreement with plan.   [PR]    Clinical Course User Index [PR] Merlyn Lot, MD     ____________________________________________   FINAL CLINICAL IMPRESSION(S) / ED DIAGNOSES  Final diagnoses:  Non-intractable vomiting with nausea, unspecified vomiting type      NEW MEDICATIONS STARTED DURING THIS VISIT:  New Prescriptions   ONDANSETRON (ZOFRAN ODT) 4 MG DISINTEGRATING TABLET    Take 1 tablet (4 mg total) by mouth every 8 (eight) hours as needed for nausea or vomiting.     Note:  This document was prepared using Dragon voice recognition software and may include unintentional dictation errors.    Merlyn Lot, MD 03/10/17 [phone]

## 2017-03-10 DIAGNOSIS — R112 Nausea with vomiting, unspecified: Secondary | ICD-10-CM | POA: Diagnosis not present

## 2017-03-10 MED ORDER — ONDANSETRON 4 MG PO TBDP: 4.0000 mg | ORAL_TABLET | Freq: Three times a day (TID) | ORAL | 0 refills | Status: DC | PRN

## 2017-03-10 NOTE — ED Notes (Signed)
Pt discharged to home.  Family member driving.  Discharge instructions reviewed.  Verbalized understanding.  No questions or concerns at this time.  Teach back verified.  Pt in NAD.  No items left in ED.   

## 2017-04-07 ENCOUNTER — Other Ambulatory Visit (HOSPITAL_BASED_OUTPATIENT_CLINIC_OR_DEPARTMENT_OTHER): Payer: Self-pay | Admitting: UROLOGY

## 2017-04-07 ENCOUNTER — Other Ambulatory Visit (INDEPENDENT_AMBULATORY_CARE_PROVIDER_SITE_OTHER): Payer: Self-pay | Admitting: Cardiovascular Disease

## 2017-04-12 ENCOUNTER — Other Ambulatory Visit (INDEPENDENT_AMBULATORY_CARE_PROVIDER_SITE_OTHER): Payer: Self-pay | Admitting: Cardiovascular Disease

## 2017-05-23 IMAGING — US US CAROTID DUPLEX BILAT
1 series · 13 of 24 positions shown · non-contrast
Comparison: None.

CLINICAL DATA: TIA. History of hypertension, syncopal episode,
hyperlipidemia, diabetes and smoking.

EXAM:
BILATERAL CAROTID DUPLEX ULTRASOUND
TECHNIQUE: Gray scale imaging, color Doppler and duplex ultrasound were
performed of bilateral carotid and vertebral arteries in the neck.

[Series 1: us carotid duplex bilat · 0.06mm/px · 13 of 70 slices shown]
[im 1/70]
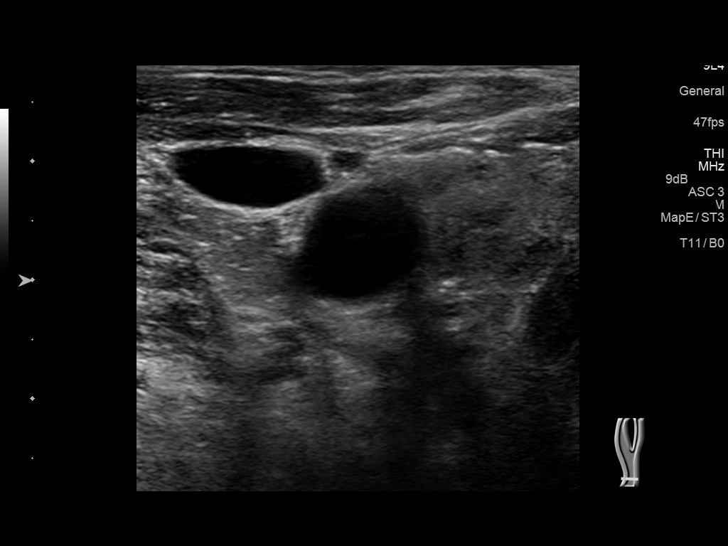
[im 7/70]
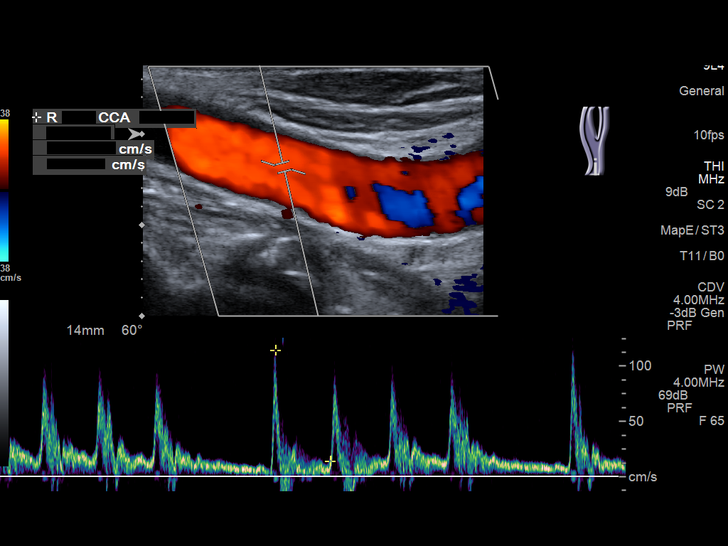
[im 13/70]
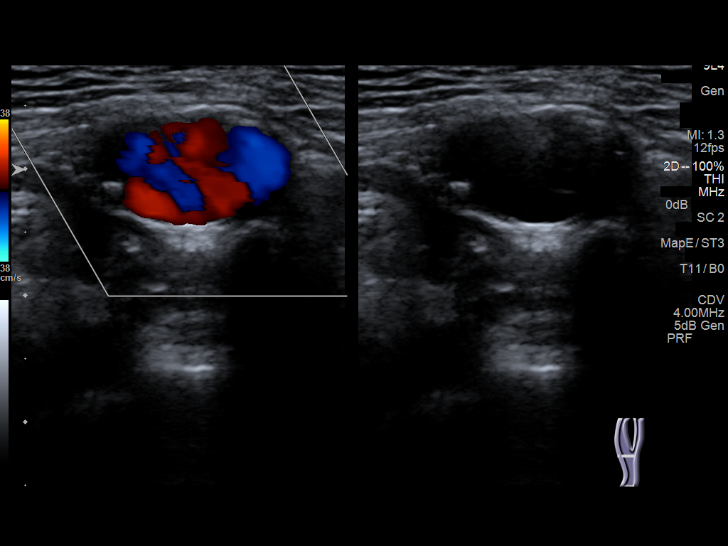
[im 19/70]
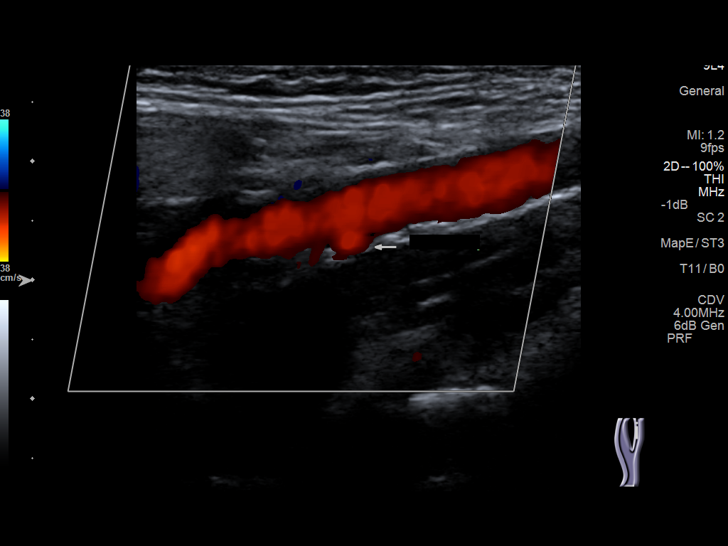
[im 25/70]
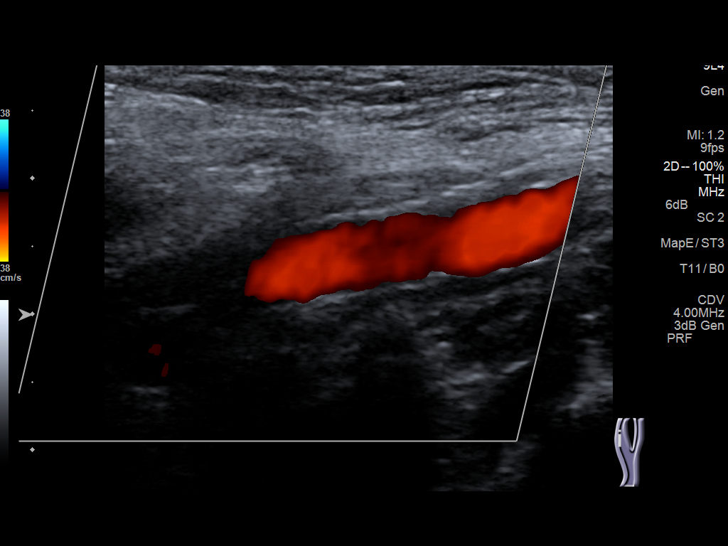
[im 31/70]
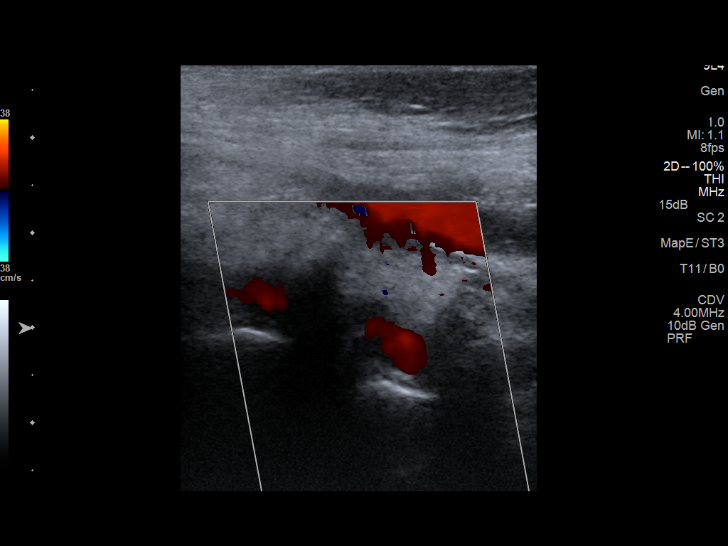
[im 37/70]
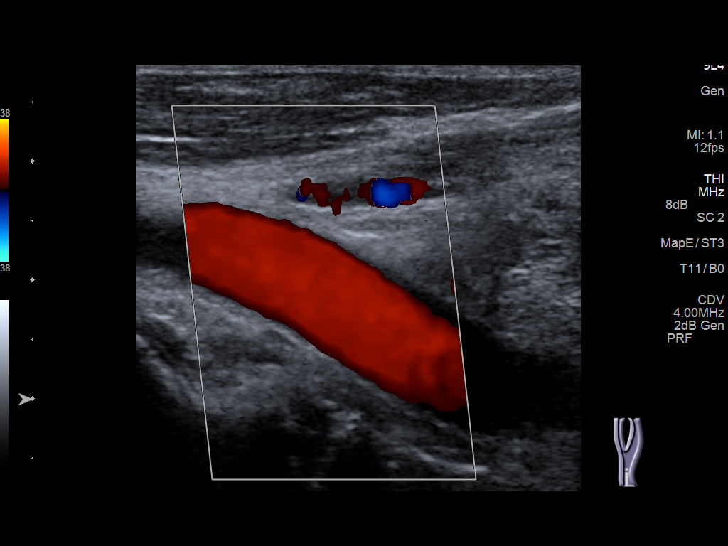
[im 40/70]
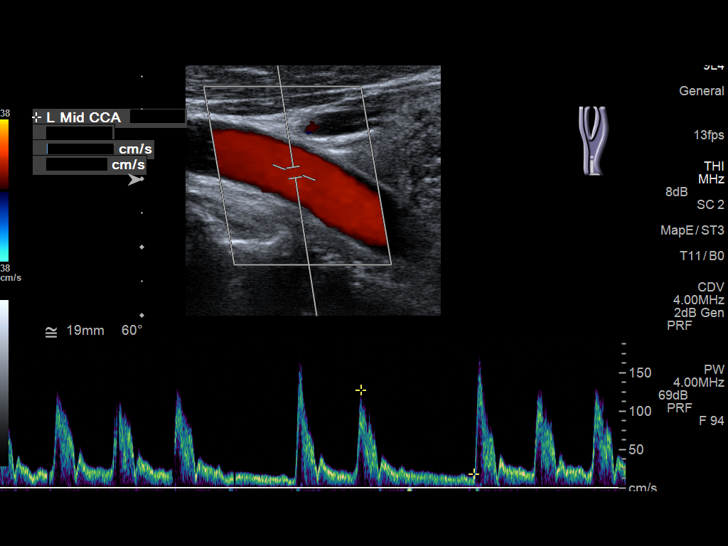
[im 46/70]
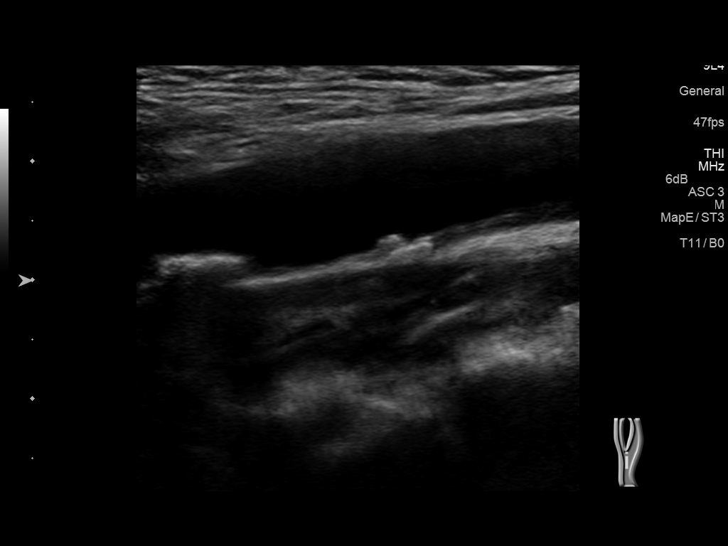
[im 52/70]
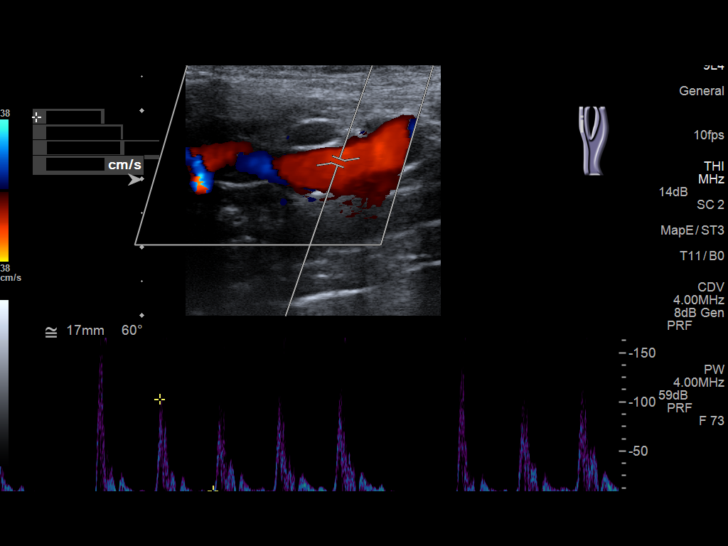
[im 58/70]
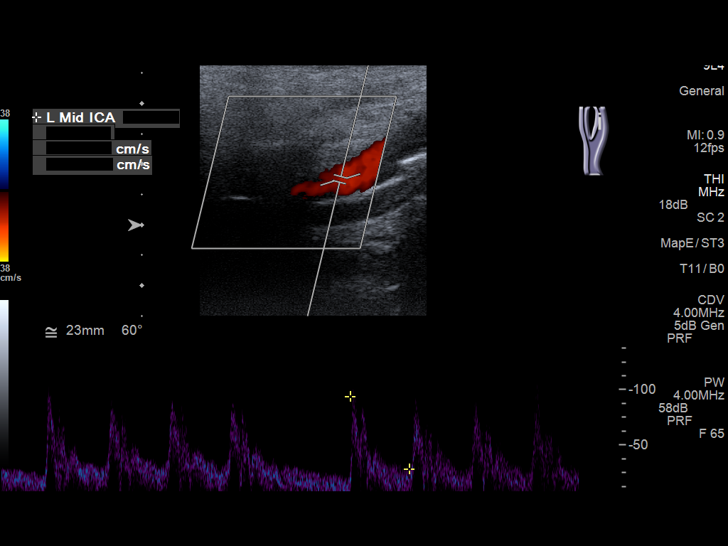
[im 64/70]
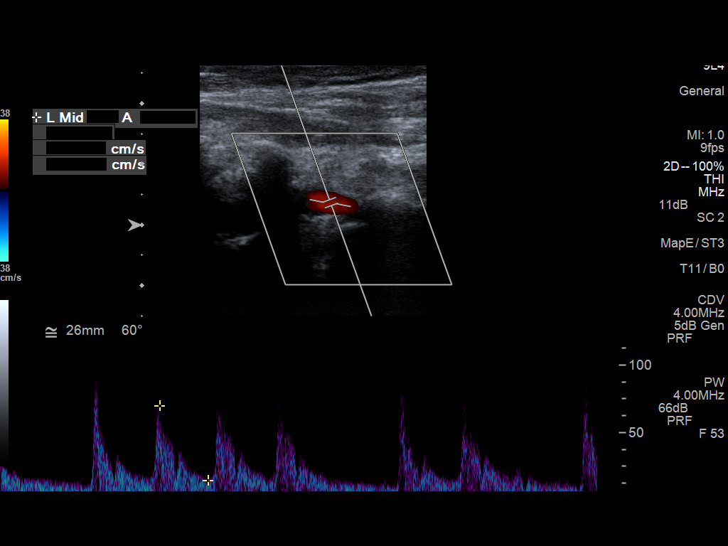
[im 70/70]
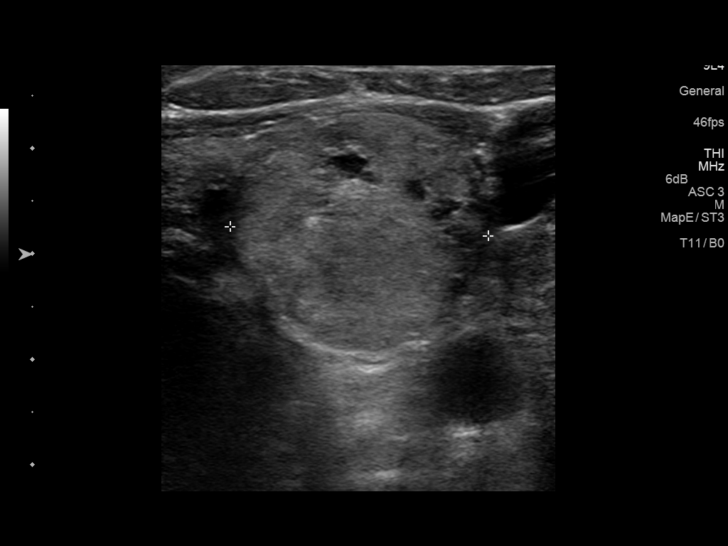

[13 of 24 positions shown; findings below may reference images not displayed]

FINDINGS: Criteria: Quantification of carotid stenosis is based on velocity
parameters that correlate the residual internal carotid diameter
with NASCET-based stenosis levels, using the diameter of the distal
internal carotid lumen as the denominator for stenosis measurement.

The following velocity measurements were obtained:

RIGHT

ICA:  84/33 cm/sec

CCA:  127/21 cm/sec

SYSTOLIC ICA/CCA RATIO:

DIASTOLIC ICA/CCA RATIO:

ECA:  127 cm/sec

LEFT

ICA:  105/39 cm/sec

CCA:  128/19 cm/sec

SYSTOLIC ICA/CCA RATIO:

DIASTOLIC ICA/CCA RATIO:

ECA:  103 cm/sec

RIGHT CAROTID ARTERY: There is a minimal amount of eccentric mixed
echogenic plaque involving the origin proximal aspect the right
internal carotid artery (image 28), not resulting in elevated peak
systolic velocities within the interrogated course of the right
internal carotid artery to suggest a hemodynamically significant
stenosis.

RIGHT VERTEBRAL ARTERY:  Antegrade the

LEFT CAROTID ARTERY: There is a minimal amount of intimal thickening
within the distal aspect of the left common carotid artery (image
49). There is a moderate amount of eccentric mixed echogenic plaque
within the left carotid bulb (images 52 and 53), extending to
involve the origin and proximal aspects of the left internal carotid
artery (image 60), not resulting in elevated peak systolic
velocities within the interrogated course of the left internal
carotid artery to suggest a hemodynamically significant stenosis.

LEFT VERTEBRAL ARTERY:  Antegrade flow

Incidental note is made of a cardiac arrhythmia.

Note is made of a approximately 2.9 x 2.3 x 2.5 cm mixed echogenic
partially cystic, predominantly solid nodule within the incidentally
imaged left lobe of the thyroid (representative images 72 through
77).
IMPRESSION: 1. Minimal to moderate amount of bilateral atherosclerotic plaque,
left greater than right, not resulting in a hemodynamically
significant stenosis within either internal carotid artery.
2. Incidental note is made of a cardiac arrhythmia. Further
evaluation with ECG monitoring could be performed as clinically
indicated.
3. Incidentally noted approximately 2.9 cm indeterminate complex
nodule within the left lobe of the thyroid. Further evaluation with
dedicated carotid Doppler ultrasound could be performed as
clinically indicated.

## 2017-06-22 ENCOUNTER — Encounter: Payer: Self-pay | Admitting: Emergency Medicine

## 2017-06-22 ENCOUNTER — Emergency Department: Payer: Medicare Other

## 2017-06-22 ENCOUNTER — Inpatient Hospital Stay
Admission: EM | Admit: 2017-06-22 | Discharge: 2017-06-23 | DRG: 637 | Disposition: A | Discharge status: Home / Self Care | Payer: Medicare Other | Attending: Specialist | Admitting: Specialist

## 2017-06-22 DIAGNOSIS — Z7901 Long term (current) use of anticoagulants: Secondary | ICD-10-CM | POA: Diagnosis not present

## 2017-06-22 DIAGNOSIS — Z79899 Other long term (current) drug therapy: Secondary | ICD-10-CM | POA: Diagnosis not present

## 2017-06-22 DIAGNOSIS — E785 Hyperlipidemia, unspecified: Secondary | ICD-10-CM | POA: Diagnosis present

## 2017-06-22 DIAGNOSIS — Z23 Encounter for immunization: Secondary | ICD-10-CM

## 2017-06-22 DIAGNOSIS — Z9114 Patient's other noncompliance with medication regimen: Secondary | ICD-10-CM

## 2017-06-22 DIAGNOSIS — N400 Enlarged prostate without lower urinary tract symptoms: Secondary | ICD-10-CM | POA: Diagnosis present

## 2017-06-22 DIAGNOSIS — Z8673 Personal history of transient ischemic attack (TIA), and cerebral infarction without residual deficits: Secondary | ICD-10-CM

## 2017-06-22 DIAGNOSIS — I482 Chronic atrial fibrillation: Secondary | ICD-10-CM | POA: Diagnosis present

## 2017-06-22 DIAGNOSIS — Z87891 Personal history of nicotine dependence: Secondary | ICD-10-CM | POA: Diagnosis not present

## 2017-06-22 DIAGNOSIS — D696 Thrombocytopenia, unspecified: Secondary | ICD-10-CM | POA: Diagnosis present

## 2017-06-22 DIAGNOSIS — R680 Hypothermia, not associated with low environmental temperature: Secondary | ICD-10-CM | POA: Diagnosis present

## 2017-06-22 DIAGNOSIS — K219 Gastro-esophageal reflux disease without esophagitis: Secondary | ICD-10-CM | POA: Diagnosis present

## 2017-06-22 DIAGNOSIS — R740 Nonspecific elevation of levels of transaminase and lactic acid dehydrogenase [LDH]: Secondary | ICD-10-CM | POA: Diagnosis present

## 2017-06-22 DIAGNOSIS — E11649 Type 2 diabetes mellitus with hypoglycemia without coma: Secondary | ICD-10-CM | POA: Diagnosis present

## 2017-06-22 DIAGNOSIS — I10 Essential (primary) hypertension: Secondary | ICD-10-CM | POA: Diagnosis present

## 2017-06-22 DIAGNOSIS — G9341 Metabolic encephalopathy: Secondary | ICD-10-CM | POA: Diagnosis present

## 2017-06-22 DIAGNOSIS — E162 Hypoglycemia, unspecified: Secondary | ICD-10-CM | POA: Diagnosis present

## 2017-06-22 DIAGNOSIS — Z794 Long term (current) use of insulin: Secondary | ICD-10-CM | POA: Diagnosis not present

## 2017-06-22 DIAGNOSIS — E119 Type 2 diabetes mellitus without complications: Secondary | ICD-10-CM

## 2017-06-22 DIAGNOSIS — T68XXXA Hypothermia, initial encounter: Secondary | ICD-10-CM

## 2017-06-22 HISTORY — DX: Other pancytopenia: D61.818

## 2017-06-22 HISTORY — DX: Transient cerebral ischemic attack, unspecified: G45.9

## 2017-06-22 HISTORY — DX: Unspecified atrial fibrillation: I48.91

## 2017-06-22 HISTORY — DX: Headache: R51

## 2017-06-22 HISTORY — DX: Headache, unspecified: R51.9

## 2017-06-22 LAB — COMPREHENSIVE METABOLIC PANEL
ALBUMIN: 3.6 g/dL (ref 3.5–5.0)
ALK PHOS: 175 U/L — AB (ref 38–126)
ALT: 33 U/L (ref 17–63)
AST: 54 U/L — AB (ref 15–41)
Anion gap: 11 (ref 5–15)
BILIRUBIN TOTAL: 1.6 mg/dL — AB (ref 0.3–1.2)
BUN: 26 mg/dL — AB (ref 6–20)
CALCIUM: 9 mg/dL (ref 8.9–10.3)
CO2: 27 mmol/L (ref 22–32)
Chloride: 101 mmol/L (ref 101–111)
Creatinine, Ser: 1.09 mg/dL (ref 0.61–1.24)
GFR calc Af Amer: >60 mL/min (ref >60)
GFR calc non Af Amer: >60 mL/min (ref >60)
GLUCOSE: 73 mg/dL (ref 65–99)
Potassium: 4.9 mmol/L (ref 3.5–5.1)
Sodium: 139 mmol/L (ref 135–145)
TOTAL PROTEIN: 6.5 g/dL (ref 6.5–8.1)

## 2017-06-22 LAB — CBC WITH DIFFERENTIAL/PLATELET
BASOS ABS: 0 10*3/uL (ref 0–0.1)
BASOS PCT: 0 %
Eosinophils Absolute: 0.1 10*3/uL (ref 0–0.7)
Eosinophils Relative: 2 %
HCT: 39.9 % — ABNORMAL LOW (ref 40.0–52.0)
Hemoglobin: 13.3 g/dL (ref 13.0–18.0)
LYMPHS PCT: 25 %
Lymphs Abs: 1 10*3/uL (ref 1.0–3.6)
MCH: 35 pg — ABNORMAL HIGH (ref 26.0–34.0)
MCHC: 33.5 g/dL (ref 32.0–36.0)
MCV: 104.5 fL — AB (ref 80.0–100.0)
MONO ABS: 0.3 10*3/uL (ref 0.2–1.0)
Monocytes Relative: 9 %
Neutro Abs: 2.5 10*3/uL (ref 1.4–6.5)
Neutrophils Relative %: 64 %
PLATELETS: 88 10*3/uL — AB (ref 150–440)
RBC: 3.81 MIL/uL — ABNORMAL LOW (ref 4.40–5.90)
RDW: 15.2 % — AB (ref 11.5–14.5)
WBC: 3.9 10*3/uL (ref 3.8–10.6)

## 2017-06-22 LAB — CORTISOL: CORTISOL PLASMA: 7.6 ug/dL

## 2017-06-22 LAB — TSH: TSH: 2.198 u[IU]/mL (ref 0.350–4.500)

## 2017-06-22 LAB — GLUCOSE, CAPILLARY
Glucose-Capillary: 68 mg/dL (ref 65–99)
Glucose-Capillary: 137 mg/dL — ABNORMAL HIGH (ref 65–99)
Glucose-Capillary: 141 mg/dL — ABNORMAL HIGH (ref 65–99)
Glucose-Capillary: 252 mg/dL — ABNORMAL HIGH (ref 65–99)

## 2017-06-22 LAB — HEMOGLOBIN A1C
HEMOGLOBIN A1C: 5.7 % — AB (ref 4.8–5.6)
MEAN PLASMA GLUCOSE: 116.89 mg/dL

## 2017-06-22 LAB — PROTIME-INR
INR: 2.95
Prothrombin Time: 30.5 seconds — ABNORMAL HIGH (ref 11.4–15.2)

## 2017-06-22 LAB — MAGNESIUM: Magnesium: 1.8 mg/dL (ref 1.7–2.4)

## 2017-06-22 LAB — TROPONIN I

## 2017-06-22 LAB — LACTIC ACID, PLASMA
Lactic Acid, Venous: 2.6 mmol/L (ref 0.5–1.9)
Lactic Acid, Venous: 2.9 mmol/L (ref 0.5–1.9)

## 2017-06-22 MED ORDER — PANTOPRAZOLE SODIUM 40 MG PO TBEC
40.0000 mg | DELAYED_RELEASE_TABLET | Freq: Every day | ORAL | Status: DC
  Administered 2017-06-22 – 2017-06-23 (×2): 40 mg via ORAL
  Filled 2017-06-22 (×2): qty 1

## 2017-06-22 MED ORDER — SODIUM CHLORIDE 0.9% FLUSH
3.0000 mL | Freq: Two times a day (BID) | INTRAVENOUS | Status: DC
  Administered 2017-06-22 – 2017-06-23 (×3): 3 mL via INTRAVENOUS

## 2017-06-22 MED ORDER — DOCUSATE SODIUM 100 MG PO CAPS
100.0000 mg | ORAL_CAPSULE | Freq: Two times a day (BID) | ORAL | Status: DC
  Administered 2017-06-23: 100 mg via ORAL
  Filled 2017-06-22 (×2): qty 1

## 2017-06-22 MED ORDER — SODIUM CHLORIDE 0.9 % IV BOLUS (SEPSIS)
1000.0000 mL | Freq: Once | INTRAVENOUS | Status: AC
  Administered 2017-06-22: 1000 mL via INTRAVENOUS

## 2017-06-22 MED ORDER — APIXABAN 5 MG PO TABS
5.0000 mg | ORAL_TABLET | Freq: Two times a day (BID) | ORAL | Status: DC
  Administered 2017-06-22 – 2017-06-23 (×2): 5 mg via ORAL
  Filled 2017-06-22 (×2): qty 1

## 2017-06-22 MED ORDER — INSULIN ASPART 100 UNIT/ML ~~LOC~~ SOLN
0.0000 [IU] | Freq: Three times a day (TID) | SUBCUTANEOUS | Status: DC
  Administered 2017-06-22: 5 [IU] via SUBCUTANEOUS
  Administered 2017-06-23: 1 [IU] via SUBCUTANEOUS
  Administered 2017-06-23: 7 [IU] via SUBCUTANEOUS
  Filled 2017-06-22 (×3): qty 1

## 2017-06-22 MED ORDER — ACETAMINOPHEN 325 MG PO TABS: 650.0000 mg | ORAL_TABLET | Freq: Four times a day (QID) | ORAL | Status: DC | PRN

## 2017-06-22 MED ORDER — INFLUENZA VAC SPLIT HIGH-DOSE 0.5 ML IM SUSY
0.5000 mL | PREFILLED_SYRINGE | INTRAMUSCULAR | Status: AC
  Administered 2017-06-23: 0.5 mL via INTRAMUSCULAR
  Filled 2017-06-22: qty 0.5

## 2017-06-22 MED ORDER — ENOXAPARIN SODIUM 40 MG/0.4ML ~~LOC~~ SOLN
SUBCUTANEOUS | Status: AC
  Filled 2017-06-22: qty 0.4

## 2017-06-22 MED ORDER — GABAPENTIN 300 MG PO CAPS
900.0000 mg | ORAL_CAPSULE | Freq: Three times a day (TID) | ORAL | Status: DC
  Administered 2017-06-22 – 2017-06-23 (×3): 900 mg via ORAL
  Filled 2017-06-22 (×3): qty 3

## 2017-06-22 MED ORDER — ONDANSETRON HCL 4 MG PO TABS: 4.0000 mg | ORAL_TABLET | Freq: Four times a day (QID) | ORAL | Status: DC | PRN

## 2017-06-22 MED ORDER — ACETAMINOPHEN 650 MG RE SUPP: 650.0000 mg | Freq: Four times a day (QID) | RECTAL | Status: DC | PRN

## 2017-06-22 MED ORDER — INSULIN DETEMIR 100 UNIT/ML ~~LOC~~ SOLN
16.0000 [IU] | Freq: Two times a day (BID) | SUBCUTANEOUS | Status: DC
  Administered 2017-06-23: 16 [IU] via SUBCUTANEOUS
  Filled 2017-06-22 (×2): qty 0.16

## 2017-06-22 MED ORDER — TAMSULOSIN HCL 0.4 MG PO CAPS
0.4000 mg | ORAL_CAPSULE | Freq: Every day | ORAL | Status: DC
  Administered 2017-06-22 – 2017-06-23 (×2): 0.4 mg via ORAL
  Filled 2017-06-22 (×2): qty 1

## 2017-06-22 MED ORDER — NIACIN 500 MG PO TABS
1000.0000 mg | ORAL_TABLET | Freq: Two times a day (BID) | ORAL | Status: DC
  Administered 2017-06-22 – 2017-06-23 (×2): 1000 mg via ORAL
  Filled 2017-06-22 (×3): qty 2

## 2017-06-22 MED ORDER — MAGNESIUM OXIDE 400 (241.3 MG) MG PO TABS
400.0000 mg | ORAL_TABLET | Freq: Two times a day (BID) | ORAL | Status: DC
  Administered 2017-06-22 – 2017-06-23 (×2): 400 mg via ORAL
  Filled 2017-06-22 (×2): qty 1

## 2017-06-22 MED ORDER — ALBUTEROL SULFATE (2.5 MG/3ML) 0.083% IN NEBU: 2.5000 mg | INHALATION_SOLUTION | RESPIRATORY_TRACT | Status: DC | PRN

## 2017-06-22 MED ORDER — ONDANSETRON HCL 4 MG/2ML IJ SOLN: 4.0000 mg | Freq: Four times a day (QID) | INTRAMUSCULAR | Status: DC | PRN

## 2017-06-22 MED ORDER — ENOXAPARIN SODIUM 40 MG/0.4ML ~~LOC~~ SOLN
40.0000 mg | SUBCUTANEOUS | Status: DC
  Administered 2017-06-22: 40 mg via SUBCUTANEOUS

## 2017-06-22 MED ORDER — INSULIN ASPART 100 UNIT/ML ~~LOC~~ SOLN
0.0000 [IU] | Freq: Three times a day (TID) | SUBCUTANEOUS | Status: DC
  Administered 2017-06-22: 1 [IU] via SUBCUTANEOUS
  Filled 2017-06-22: qty 1

## 2017-06-22 MED ORDER — CLONIDINE HCL 0.1 MG PO TABS
0.1000 mg | ORAL_TABLET | Freq: Two times a day (BID) | ORAL | Status: DC
Start: 2017-06-22 — End: 2017-06-23
  Administered 2017-06-22 – 2017-06-23 (×2): 0.1 mg via ORAL
  Filled 2017-06-22 (×2): qty 1

## 2017-06-22 MED ORDER — VITAMIN C 500 MG PO TABS
1000.0000 mg | ORAL_TABLET | Freq: Three times a day (TID) | ORAL | Status: DC
Start: 2017-06-22 — End: 2017-06-23
  Administered 2017-06-22 – 2017-06-23 (×2): 1000 mg via ORAL
  Filled 2017-06-22 (×5): qty 2

## 2017-06-22 MED ORDER — SOTALOL HCL 80 MG PO TABS
80.0000 mg | ORAL_TABLET | Freq: Every day | ORAL | Status: DC
  Administered 2017-06-22 – 2017-06-23 (×2): 80 mg via ORAL
  Filled 2017-06-22 (×2): qty 1

## 2017-06-22 MED ORDER — SULFASALAZINE 500 MG PO TABS
1000.0000 mg | ORAL_TABLET | Freq: Two times a day (BID) | ORAL | Status: DC
  Administered 2017-06-22 – 2017-06-23 (×2): 1000 mg via ORAL
  Filled 2017-06-22 (×3): qty 2

## 2017-06-22 MED ORDER — POLYETHYLENE GLYCOL 3350 17 G PO PACK: 17.0000 g | PACK | Freq: Every day | ORAL | Status: DC | PRN

## 2017-06-22 NOTE — ED Notes (Signed)
Patient transported to CT 

## 2017-06-22 NOTE — ED Notes (Signed)
Admission MD at bedside.  

## 2017-06-22 NOTE — ED Provider Notes (Signed)
[facility] Emergency Department Provider Note   ____________________________________________   None    (approximate)  I have reviewed the triage vital signs and the nursing notes.   HISTORY  Chief Complaint Altered Mental Status EM caveat: Patient unable to recall  HPI Bernard Pennington is a 71 y.o. male who presents for evaluation of confusion  patient presents for evaluation of confusion. Wife reports he went to bed last night normally. This morning she went for work and about 7 AM, she got home a little while ago and found the patient sitting in a chair confused, was reporting that he thought there in [place]. There at someone else's house with the same painting on the wall. She then gave him orange juice because his blood sugar meter was not working properly.  Patient reports he doesn't really know what happened. He was last witnessed to be well at about 2 AM last night.  He denies any pain. He reports he feels "lightheaded". Denies chest pain. No abdominal pain. He reports he felt very cold, and his family reports that he felt very cold and shivering. He is not any recent illness. Not aware of any cough or complaints otherwise   Past Medical History:  Diagnosis Date  . A-fib ([place])   . Constipation   . Diabetes mellitus without complication ([place])   . GERD (gastroesophageal reflux disease)   . Hyperlipemia   . Hypertension   . Pancytopenia ([place])   . TIA (transient ischemic attack)     Patient Active Problem List   Diagnosis Date Noted  . Hypoglycemia 06/22/2017  . TIA (transient ischemic attack) 08/19/2015    History reviewed. No pertinent surgical history.  Prior to Admission medications   Medication Sig Start Date End Date Taking? Authorizing Provider  apixaban (ELIQUIS) 5 MG TABS tablet 1 tablet 2 (two) times daily. 04/07/17 07/06/17 Yes [provider]  Ascorbic Acid (VITAMIN C) 1000 MG tablet Take 1,000 mg by mouth 3  (three) times daily.   Yes [provider]  CINNAMON PO Take 1 tablet by mouth 3 (three) times daily before meals.   Yes [provider]  cloNIDine (CATAPRES) 0.1 MG tablet Take 0.1 mg by mouth 2 (two) times daily.    Yes [provider]  docusate sodium (COLACE) 100 MG capsule Take 100 mg by mouth 2 (two) times daily.   Yes [provider]  gabapentin (NEURONTIN) 300 MG capsule Take 900 mg by mouth 3 (three) times daily.   Yes [provider]  insulin NPH Human (HUMULIN N,NOVOLIN N) 100 UNIT/ML injection Inject 16 Units into the skin 2 (two) times daily.   Yes [provider]  insulin regular (NOVOLIN R,HUMULIN R) 100 units/mL injection Inject 6-10 Units into the skin 4 (four) times daily. Pt uses per sliding scale.   Yes [provider]  magnesium oxide (MAG-OX) 400 MG tablet Take 400 mg by mouth 2 (two) times daily.   Yes [provider]  metFORMIN (GLUCOPHAGE) 500 MG tablet Take 500 mg by mouth 2 (two) times daily with a meal.   Yes [provider]  Multiple Vitamin (MULTIVITAMIN WITH MINERALS) TABS tablet Take 1 tablet by mouth daily.   Yes [provider]  niacin 500 MG tablet Take 1,000 mg by mouth 2 (two) times daily.   Yes [provider]  Omega-3 Fatty Acids (FISH OIL) 1000 MG CAPS Take 1,000 mg by mouth daily.   Yes [provider]  omeprazole (  PRILOSEC) 20 MG capsule Take 40 mg by mouth daily.   Yes [provider]  sotalol (BETAPACE) 80 MG tablet 1 tablet 2 (two) times daily. 04/14/17  Yes [provider]  sulfaSALAzine (AZULFIDINE) 500 MG tablet Take 1,000 mg by mouth 2 (two) times daily.   Yes [provider]  tamsulosin (FLOMAX) 0.4 MG CAPS capsule Take 1 capsule by mouth daily.   Yes [provider]  VITAMIN E PO Take 1 capsule by mouth 2 (two) times daily.   Yes [provider]  loperamide (IMODIUM) 2 MG capsule Take 2 mg by mouth  as needed for diarrhea or loose stools.    [provider]  ondansetron (ZOFRAN ODT) 4 MG disintegrating tablet Take 1 tablet (4 mg total) by mouth every 8 (eight) hours as needed for nausea or vomiting. 03/10/17   Merlyn Lot, MD  ranitidine (ZANTAC) 150 MG tablet Take 150 mg by mouth 2 (two) times daily as needed for heartburn.    [provider]  verapamil (CALAN) 120 MG tablet Take 1 tablet (120 mg total) by mouth 2 (two) times daily. Patient not taking: Reported on 06/22/2017 08/20/15   Henreitta Leber, MD    Allergies Patient has no known allergies.  Family History  Problem Relation Age of Onset  . Heart attack Mother   . Stroke Father   . Cancer Father     Social History Social History  Substance Use Topics  . Smoking status: Former Research scientist (life sciences)  . Smokeless tobacco: Never Used  . Alcohol use Yes    Review of Systems caveat ____________________________________________   PHYSICAL EXAM:  VITAL SIGNS: ED Triage Vitals  Enc Vitals Group     BP 06/22/17 1229 (!) 144/58     Pulse Rate 06/22/17 1229 (!) 51     Resp 06/22/17 1229 16     Temp 06/22/17 1229 (!) 94.2 F (34.6 C)     Temp Source 06/22/17 1229 Oral     SpO2 06/22/17 1229 100 %     Weight 06/22/17 1230 180 lb (81.6 kg)     Height 06/22/17 1230 6\' 1"  (1.854 m)     Head Circumference --      Peak Flow --      Pain Score --      Pain Loc --      Pain Edu? --      Excl. in [place]? --     Constitutional: Alert and oriented. general ill appearing, appears fatigued but in no acute extremities. Eyes: Conjunctivae are normal. Head: Atraumatic. Nose: No congestion/rhinnorhea. Mouth/Throat: Mucous membranes are moist. Neck: No stridor.  no rigidity. No meningismus. Cardiovascular: slightly bradycardic rate, regular rhythm. Grossly normal heart sounds.  Good peripheral circulation. Respiratory: Normal respiratory effort.  No retractions. Lungs CTAB. Gastrointestinal: Soft and nontender. No  distention. Musculoskeletal: No lower extremity tenderness nor edema. Neurologic:  Normal speech and language. No gross focal neurologic deficits are appreciated.  Skin:  Skin is warm, dry and intact. No rash noted. Psychiatric: Mood and affect are normal. Speech and behavior are normal.  ____________________________________________   LABS (all labs ordered are listed, but only abnormal results are displayed)  Labs Reviewed  COMPREHENSIVE METABOLIC PANEL - Abnormal; Notable for the following:       Result Value   BUN 26 (*)    AST 54 (*)    Alkaline Phosphatase 175 (*)    Total Bilirubin 1.6 (*)    All other components within normal  limits  CBC WITH DIFFERENTIAL/PLATELET - Abnormal; Notable for the following:    RBC 3.81 (*)    HCT 39.9 (*)    MCV 104.5 (*)    MCH 35.0 (*)    RDW 15.2 (*)    Platelets 88 (*)    All other components within normal limits  LACTIC ACID, PLASMA - Abnormal; Notable for the following:    Lactic Acid, Venous 2.6 (*)    All other components within normal limits  PROTIME-INR - Abnormal; Notable for the following:    Prothrombin Time 30.5 (*)    All other components within normal limits  GLUCOSE, CAPILLARY - Abnormal; Notable for the following:    Glucose-Capillary 137 (*)    All other components within normal limits  CULTURE, BLOOD (ROUTINE X 2)  CULTURE, BLOOD (ROUTINE X 2)  GLUCOSE, CAPILLARY  TROPONIN I  TSH  MAGNESIUM  URINALYSIS, ROUTINE W REFLEX MICROSCOPIC  LACTIC ACID, PLASMA  CORTISOL  HEMOGLOBIN A1C  CBG MONITORING, ED   ____________________________________________  EKG  reviewed and interpreted by me at 1245 Heart rate 50 QRS 90 QTc 450 Sinus bradycardia, no evidence of ischemia noted, possible old septal ____________________________________________  RADIOLOGY  Ct Head Wo Contrast  Result Date: 06/22/2017 CLINICAL DATA:  Altered level of consciousness. EXAM: CT HEAD WITHOUT CONTRAST TECHNIQUE: Contiguous axial images  were obtained from the base of the skull through the vertex without intravenous contrast. COMPARISON:  None. FINDINGS: Brain: No evidence of acute infarction, hemorrhage, extra-axial collection, ventriculomegaly, or mass effect. Generalized cerebral atrophy. Periventricular white matter low attenuation likely secondary to microangiopathy. Vascular: Cerebrovascular atherosclerotic calcifications are noted. Skull: Negative for fracture or focal lesion. Sinuses/Orbits: Visualized portions of the orbits are unremarkable. Visualized portions of the paranasal sinuses and mastoid air cells are unremarkable. Other: None. IMPRESSION: 1. No acute intracranial pathology. 2. Chronic microvascular disease and cerebral atrophy. Electronically Signed   By: Kathreen Devoid   On: 06/22/2017 13:20   Dg Chest Port 1 View  Result Date: 06/22/2017 CLINICAL DATA:  Hypothermia EXAM: PORTABLE CHEST 1 VIEW COMPARISON:  10/18/2014 FINDINGS: Normal heart size and stable mild aortic tortuosity. There is no edema, consolidation, effusion, or pneumothorax. Remote posterior left seventh rib fracture. IMPRESSION: No evidence of active disease. Electronically Signed   By: Monte Fantasia M.D.   On: 06/22/2017 13:24   CT of the head negative for acute. Chest x-ray negative for acute. reviewed by me ____________________________________________   PROCEDURES  Procedure(s) performed: None  Procedures  Critical Care performed: No  ____________________________________________   INITIAL IMPRESSION / ASSESSMENT AND PLAN / ED COURSE  Pertinent labs & imaging results that were available during my care of the patient were reviewed by me and considered in my medical decision making (see chart for details).  patient presents for evaluation for feeling lightheaded or fatigued. No neuro deficits. Van scale negative. NIH is 0. Doubt acute neurologic, but she is noted to be hypothermic, and also suspect she may have had hypoglycemia at home  but this is somewhat unclear. Concern is elevated for possible infectious etiology, but workup thus far has not demonstrated. Spoke with Dr. Darvin Neighbours, the hospitalist service and the patient will be admitted for close observation and further workup. Did cancel the code sepsis, as the patient does not demonstrate any obvious evidence of sepsis such as elevated white count, pneumonia, and is not endorsing systemic infectious symptoms at this time. Suspect possibly hypoglycemia may have been prolonged and undetected for several hours at home.  Patient be admitted for further workup and observation      ____________________________________________   FINAL CLINICAL IMPRESSION(S) / ED DIAGNOSES  Final diagnoses:  Hypothermia, initial encounter      NEW MEDICATIONS STARTED DURING THIS VISIT:  New Prescriptions   No medications on file     Note:  This document was prepared using Dragon voice recognition software and may include unintentional dictation errors.     Delman Kitten, MD 06/22/17 [phone]

## 2017-06-22 NOTE — ED Notes (Signed)
ED Provider at bedside. 

## 2017-06-22 NOTE — ED Notes (Signed)
Report off to Agricultural engineer.

## 2017-06-22 NOTE — ED Triage Notes (Signed)
Pt was altered at home per wife.  He did not know his name or his wifes name.  Had tv on without sound. She found his CPAP on floor but still on.  Pt is diabetic and last shots at 2 am.  Wife found him like this at 48 when got home.  She thought sugar was maybe low so gave him juice. Sugar here 68 after juice at home but his meter broken so unable to check.  Orange juice given in triage.  She reports he seems a little better. Pt able to answer year and month now.  Seems drowsy.  Hx TIA as well per wife, was not in hx, added today.  Temp low in triage despite multiple attempts.

## 2017-06-22 NOTE — ED Notes (Signed)
Bear hugger placed on patient.

## 2017-06-22 NOTE — ED Notes (Addendum)
Resumed care from kendall rn   Pt alert.   brady on monitor. Iv fluids infusing.  warming blanket in place.  Family with pt.    meds given.

## 2017-06-22 NOTE — H&P (Signed)
[facility] [place] NAME: Bernard Pennington    MR#:  [id number]  DATE OF BIRTH:  [birth year]  DATE OF ADMISSION:  06/22/2017  PRIMARY CARE PHYSICIAN: System, Pcp Not In   REQUESTING/REFERRING PHYSICIAN: Dr. Jacqualine Code  CHIEF COMPLAINT:   Chief Complaint  Patient presents with  . Altered Mental Status    HISTORY OF PRESENT ILLNESS:  Bernard Pennington  is a 71 y.o. male with a known history of A. Fib,hypertension, insulin-dependent diabetes mellitus, pancytopenia presents to the emergency room with EMS after wife called them due to altered mental status. She tried checking the blood sugar at home but the glucometer would not work. She gave him some orange juice and with EMS blood sugars were 68. He still was confused but improved in the emergency room. Now he is close to baseline. His core temperature was found to be 65F and the last one is still 95.22F in spite of being on a bear hugger. Lactic acid elevated at 2.4. Patient has no focal neurological deficits. No signs of infection. Patient is being admitted due to elevated lactic acid and hypothermia. Cultures have been drawn but no antibiotics started. Patient has noticed increased blood sugars over the past few days and took extra insulin with his meals and yesterday bedtime.  PAST MEDICAL HISTORY:   Past Medical History:  Diagnosis Date  . Constipation   . Diabetes mellitus without complication ([place])   . GERD (gastroesophageal reflux disease)   . Hyperlipemia   . Hypertension   . TIA (transient ischemic attack)     PAST SURGICAL HISTORY:  History reviewed. No pertinent surgical history.  SOCIAL HISTORY:   Social History  Substance Use Topics  . Smoking status: Former Research scientist (life sciences)  . Smokeless tobacco: Never Used  . Alcohol use Yes    FAMILY HISTORY:   Family History  Problem Relation Age of Onset  . Heart attack Mother   . Stroke Father   . Cancer Father     DRUG ALLERGIES:   No Known Allergies  REVIEW OF SYSTEMS:   Review of Systems  Constitutional: Negative for chills and fever.  HENT: Negative for sore throat.   Eyes: Negative for blurred vision, double vision and pain.  Respiratory: Negative for cough, hemoptysis, shortness of breath and wheezing.   Cardiovascular: Negative for chest pain, palpitations, orthopnea and leg swelling.  Gastrointestinal: Negative for abdominal pain, constipation, diarrhea, heartburn, nausea and vomiting.  Genitourinary: Negative for dysuria and hematuria.  Musculoskeletal: Negative for back pain and joint pain.  Skin: Negative for rash.  Neurological: Negative for sensory change, speech change, focal weakness and headaches.  Endo/Heme/Allergies: Does not bruise/bleed easily.  Psychiatric/Behavioral: Negative for depression. The patient is not nervous/anxious.     MEDICATIONS AT HOME:   Prior to Admission medications   Medication Sig Start Date End Date Taking? Authorizing Provider  apixaban (ELIQUIS) 5 MG TABS tablet 1 tablet 2 (two) times daily. 04/07/17 07/06/17 Yes [provider]  Ascorbic Acid (VITAMIN C) 1000 MG tablet Take 1,000 mg by mouth 3 (three) times daily.   Yes [provider]  CINNAMON PO Take 1 tablet by mouth 3 (three) times daily before meals.   Yes [provider]  cloNIDine (CATAPRES) 0.1 MG tablet Take 0.1 mg by mouth 2 (two) times daily.    Yes [provider]  docusate sodium (COLACE) 100 MG capsule Take 100 mg by mouth 2 (two) times daily.   Yes [provider]  gabapentin (NEURONTIN) 300 MG capsule Take 900 mg by mouth 3 (three) times daily.   Yes [provider]  insulin NPH Human (HUMULIN N,NOVOLIN N) 100 UNIT/ML injection Inject 16 Units into the skin 2 (two) times daily.   Yes [provider]  insulin regular (NOVOLIN R,HUMULIN R) 100 units/mL injection Inject 6-10 Units into the skin 4 (four) times daily. Pt uses per sliding scale.    Yes [provider]  magnesium oxide (MAG-OX) 400 MG tablet Take 400 mg by mouth 2 (two) times daily.   Yes [provider]  metFORMIN (GLUCOPHAGE) 500 MG tablet Take 500 mg by mouth 2 (two) times daily with a meal.   Yes [provider]  Multiple Vitamin (MULTIVITAMIN WITH MINERALS) TABS tablet Take 1 tablet by mouth daily.   Yes [provider]  niacin 500 MG tablet Take 1,000 mg by mouth 2 (two) times daily.   Yes [provider]  Omega-3 Fatty Acids (FISH OIL) 1000 MG CAPS Take 1,000 mg by mouth daily.   Yes [provider]  omeprazole (PRILOSEC) 20 MG capsule Take 40 mg by mouth daily.   Yes [provider]  sotalol (BETAPACE) 80 MG tablet 1 tablet 2 (two) times daily. 04/14/17  Yes [provider]  sulfaSALAzine (AZULFIDINE) 500 MG tablet Take 1,000 mg by mouth 2 (two) times daily.   Yes [provider]  tamsulosin (FLOMAX) 0.4 MG CAPS capsule Take 1 capsule by mouth daily.   Yes [provider]  VITAMIN E PO Take 1 capsule by mouth 2 (two) times daily.   Yes [provider]  loperamide (IMODIUM) 2 MG capsule Take 2 mg by mouth as needed for diarrhea or loose stools.    [provider]  ondansetron (ZOFRAN ODT) 4 MG disintegrating tablet Take 1 tablet (4 mg total) by mouth every 8 (eight) hours as needed for nausea or vomiting. 03/10/17   Merlyn Lot, MD  ranitidine (ZANTAC) 150 MG tablet Take 150 mg by mouth 2 (two) times daily as needed for heartburn.    [provider]  verapamil (CALAN) 120 MG tablet Take 1 tablet (120 mg total) by mouth 2 (two) times daily. Patient not taking: Reported on 06/22/2017 08/20/15   Henreitta Leber, MD     VITAL SIGNS:  Blood pressure (!) 147/68, Pennington (!) 48, temperature (!) 95.6 F (35.3 C), temperature source Rectal, resp. rate 16, height 6\' 1"  (1.854 m), weight 81.6 kg (180 lb), SpO2 100 %.  PHYSICAL EXAMINATION:  Physical  Exam  GENERAL:  71 y.o.-year-old patient lying in the bed with no acute distress.  EYES: Pupils equal, round, reactive to light and accommodation. No scleral icterus. Extraocular muscles intact.  HEENT: Head atraumatic, normocephalic. Oropharynx and nasopharynx clear. No oropharyngeal erythema, moist oral mucosa  NECK:  Supple, no jugular venous distention. No thyroid enlargement, no tenderness.  LUNGS: Normal breath sounds bilaterally, no wheezing, rales, rhonchi. No use of accessory muscles of respiration.  CARDIOVASCULAR: S1, S2 normal. No murmurs, rubs, or gallops.  ABDOMEN: Soft, nontender, nondistended. Bowel sounds present. No organomegaly or mass.  EXTREMITIES: No pedal edema, cyanosis, or clubbing. + 2 pedal & radial pulses b/l.   NEUROLOGIC: Cranial nerves II through XII are intact. No focal Motor or sensory deficits appreciated b/l PSYCHIATRIC: The patient is alert and oriented x 3. Good affect.  SKIN: No obvious rash, lesion, or ulcer.   LABORATORY PANEL:   CBC  Recent Labs Lab 06/22/17  1255  WBC 3.9  HGB 13.3  HCT 39.9*  PLT 88*   ------------------------------------------------------------------------------------------------------------------  Chemistries   Recent Labs Lab 06/22/17 1255  NA 139  K 4.9  CL 101  CO2 27  GLUCOSE 73  BUN 26*  CREATININE 1.09  CALCIUM 9.0  AST 54*  ALT 33  ALKPHOS 175*  BILITOT 1.6*   ------------------------------------------------------------------------------------------------------------------  Cardiac Enzymes  Recent Labs Lab 06/22/17 1255  TROPONINI <0.03   ------------------------------------------------------------------------------------------------------------------  RADIOLOGY:  Ct Head Wo Contrast  Result Date: 06/22/2017 CLINICAL DATA:  Altered level of consciousness. EXAM: CT HEAD WITHOUT CONTRAST TECHNIQUE: Contiguous axial images were obtained from the base of the skull through the vertex without  intravenous contrast. COMPARISON:  None. FINDINGS: Brain: No evidence of acute infarction, hemorrhage, extra-axial collection, ventriculomegaly, or mass effect. Generalized cerebral atrophy. Periventricular white matter low attenuation likely secondary to microangiopathy. Vascular: Cerebrovascular atherosclerotic calcifications are noted. Skull: Negative for fracture or focal lesion. Sinuses/Orbits: Visualized portions of the orbits are unremarkable. Visualized portions of the paranasal sinuses and mastoid air cells are unremarkable. Other: None. IMPRESSION: 1. No acute intracranial pathology. 2. Chronic microvascular disease and cerebral atrophy. Electronically Signed   By: Kathreen Devoid   On: 06/22/2017 13:20   Dg Chest Port 1 View  Result Date: 06/22/2017 CLINICAL DATA:  Hypothermia EXAM: PORTABLE CHEST 1 VIEW COMPARISON:  10/18/2014 FINDINGS: Normal heart size and stable mild aortic tortuosity. There is no edema, consolidation, effusion, or pneumothorax. Remote posterior left seventh rib fracture. IMPRESSION: No evidence of active disease. Electronically Signed   By: Monte Fantasia M.D.   On: 06/22/2017 13:24     IMPRESSION AND PLAN:   * hypoglycemia  With acute metabolic encephalopathy due to increased dose of insulin Now improved. We will monitor. Start sliding scale insulin. Accu-Cheks ordered.  * hypothermia is likely due to his hypoglycemia. We'll continue [facility]. Cardiac monitoring.  * . Elevated lactic acid is likely to his hypoglycemia. Did not see any source of infection. Cultures ordered. No antibiotics at this time. Patient has returned to his normal state.  * atrial fibrillation with bradycardia. He is on sotalol 80 twice a day. Will decrease this to 80 daily. On Eliquis.  Patient is being admitted under observation. Likely discharge tomorrow  All the records are reviewed and case discussed with ED provider. Management plans discussed with the patient, family and they are  in agreement.  CODE STATUS: FULL CODE  TOTAL TIME TAKING CARE OF THIS PATIENT: 35 minutes.   Hillary Bow R M.D on 06/22/2017 at 2:59 PM  Between 7am to 6pm - Pager - [phone]  After 6pm go to [email or web address] - password [email or web address] [facility]  Office  [phone]  CC: Primary care physician; System, Pcp Not In  Note: This dictation was prepared with Dragon dictation along with smaller phrase technology. Any transcriptional errors that result from this process are unintentional.

## 2017-06-23 DIAGNOSIS — E11649 Type 2 diabetes mellitus with hypoglycemia without coma: Secondary | ICD-10-CM | POA: Diagnosis present

## 2017-06-23 DIAGNOSIS — Z23 Encounter for immunization: Secondary | ICD-10-CM | POA: Diagnosis not present

## 2017-06-23 LAB — BASIC METABOLIC PANEL
ANION GAP: 5 (ref 5–15)
BUN: 23 mg/dL — ABNORMAL HIGH (ref 6–20)
CO2: 25 mmol/L (ref 22–32)
Calcium: 8.2 mg/dL — ABNORMAL LOW (ref 8.9–10.3)
Chloride: 103 mmol/L (ref 101–111)
Creatinine, Ser: 1.05 mg/dL (ref 0.61–1.24)
GFR calc Af Amer: >60 mL/min (ref >60)
GLUCOSE: 232 mg/dL — AB (ref 65–99)
POTASSIUM: 4.3 mmol/L (ref 3.5–5.1)
Sodium: 133 mmol/L — ABNORMAL LOW (ref 135–145)

## 2017-06-23 LAB — CBC
HEMATOCRIT: 34.5 % — AB (ref 40.0–52.0)
HEMOGLOBIN: 11.6 g/dL — AB (ref 13.0–18.0)
MCH: 34.5 pg — AB (ref 26.0–34.0)
MCHC: 33.5 g/dL (ref 32.0–36.0)
MCV: 103 fL — ABNORMAL HIGH (ref 80.0–100.0)
Platelets: 77 10*3/uL — ABNORMAL LOW (ref 150–440)
RBC: 3.35 MIL/uL — ABNORMAL LOW (ref 4.40–5.90)
RDW: 15.5 % — ABNORMAL HIGH (ref 11.5–14.5)
WBC: 4.5 10*3/uL (ref 3.8–10.6)

## 2017-06-23 LAB — LACTIC ACID, PLASMA: LACTIC ACID, VENOUS: 1.7 mmol/L (ref 0.5–1.9)

## 2017-06-23 LAB — GLUCOSE, CAPILLARY
GLUCOSE-CAPILLARY: 150 mg/dL — AB (ref 65–99)
Glucose-Capillary: 307 mg/dL — ABNORMAL HIGH (ref 65–99)

## 2017-06-23 MED ORDER — SOTALOL HCL 80 MG PO TABS: 80.0000 mg | ORAL_TABLET | Freq: Every day | ORAL | Status: AC

## 2017-06-23 MED ORDER — INSULIN ASPART 100 UNIT/ML ~~LOC~~ SOLN: 4.0000 [IU] | Freq: Three times a day (TID) | SUBCUTANEOUS | 11 refills | Status: DC

## 2017-06-23 MED ORDER — INSULIN ASPART 100 UNIT/ML ~~LOC~~ SOLN: SUBCUTANEOUS | 11 refills | Status: DC

## 2017-06-23 MED ORDER — BLOOD GLUCOSE MONITOR KIT: PACK | 0 refills | Status: DC

## 2017-06-23 NOTE — Discharge Summary (Signed)
[facility] [place] NAME: Bernard Pennington    MR#:  [id number]  DATE OF BIRTH:  [birth year]  DATE OF ADMISSION:  06/22/2017 ADMITTING PHYSICIAN: Hillary Bow, MD  DATE OF DISCHARGE: 06/23/2017  PRIMARY CARE PHYSICIAN: System, Pcp Not In    ADMISSION DIAGNOSIS:  Hypothermia, initial encounter [T68.XXXA]  DISCHARGE DIAGNOSIS:  Active Problems:   Hypoglycemia   SECONDARY DIAGNOSIS:   Past Medical History:  Diagnosis Date  . A-fib ([place])   . Constipation   . Diabetes mellitus without complication ([place])   . GERD (gastroesophageal reflux disease)   . Headache   . Hyperlipemia   . Hypertension   . Pancytopenia ([place])   . TIA (transient ischemic attack)     HOSPITAL COURSE:   71 year old male with past medical history of chronic thrombocytopenia-hypertension, hyperlipidemia, chronic atrial fibrillation, diabetes who presented to the hospital due to altered mental status and noted to be hypoglycemic and hypothermic.  1. Altered mental status-metabolic encephalopathy secondary to severe hypoglycemia and hypothermia. -As patient's temperature has improved and his blood sugars improved his mental status is not back to baseline.  2. Hypoglycemia-this was secondary to patient taking increasing insulin that he was not supposed to be taking. -Diabetes or narcotics was obtained. Patient's hemoglobin A1c is actually 5.7 and it's well-controlled. Has been diabetes coordinator patient will continue his NPH but we will switch his regular insulin to NovoLog insulin and place him on a NovoLog insulin sliding scale with meals. Patient will also be referred to Dr. Gabriel Carina as an outpatient for management for his diabetes. -Hypoglycemia has now resolved.  3. Hypothermia-secondary to the hypoglycemia, no evidence of acute sepsis. Temperature has improved and patient has no evidence of acute infection.  4. Elevated lactic acid-this was secondary to his  hypoglycemia. This is also normalized as his blood sugars corrected.  5. Atrial fibrillation-this is chronic for the patient. Patient was noted to be somewhat bradycardic with heart rates in the 50s. He is on sotalol and I'm cutting his dose in half. Patient to follow-up with his cardiologist as an outpatient with further changes to his regimen. Patient will continue Eliquis for anticoagulation.  6. Essential hypertension-patient will continue her clonidine  7. BPH-patient will continue his Flomax.   DISCHARGE CONDITIONS:   Stable  CONSULTS OBTAINED:    DRUG ALLERGIES:  No Known Allergies  DISCHARGE MEDICATIONS:   Allergies as of 06/23/2017   No Known Allergies     Medication List    STOP taking these medications   insulin regular 100 units/mL injection Commonly known as:  NOVOLIN R,HUMULIN R   verapamil 120 MG tablet Commonly known as:  CALAN     TAKE these medications   blood glucose meter kit and supplies Kit Dispense based on patient and insurance preference. Use up to four times daily as directed. (FOR ICD-9 250.00, 250.01).   CINNAMON PO Take 1 tablet by mouth 3 (three) times daily before meals.   cloNIDine 0.1 MG tablet Commonly known as:  CATAPRES Take 0.1 mg by mouth 2 (two) times daily.   docusate sodium 100 MG capsule Commonly known as:  COLACE Take 100 mg by mouth 2 (two) times daily.   ELIQUIS 5 MG Tabs tablet Generic drug:  apixaban 1 tablet 2 (two) times daily.   Fish Oil 1000 MG Caps Take 1,000 mg by mouth daily.   gabapentin 300 MG capsule Commonly known as:  NEURONTIN Take 900 mg by mouth 3 (three)  times daily.   insulin aspart 100 UNIT/ML injection Commonly known as:  novoLOG Inject 4 Units into the skin 3 (three) times daily with meals.   insulin aspart 100 UNIT/ML injection Commonly known as:  NOVOLOG TID w/ meals.    BS < 120 - 0 units BS 121-150 - 1 unit SQ BS 151-200 - 2 units SQ BS 201-250 - 3 units SQ BS 251-300 - 4 units SQ  BS 301-350 - 5 units SQ BS 351-400 - 6 units SQ   > 400 Call PCP.   insulin NPH Human 100 UNIT/ML injection Commonly known as:  HUMULIN N,NOVOLIN N Inject 16 Units into the skin 2 (two) times daily.   loperamide 2 MG capsule Commonly known as:  IMODIUM Take 2 mg by mouth as needed for diarrhea or loose stools.   magnesium oxide 400 MG tablet Commonly known as:  MAG-OX Take 400 mg by mouth 2 (two) times daily.   metFORMIN 500 MG tablet Commonly known as:  GLUCOPHAGE Take 500 mg by mouth 2 (two) times daily with a meal.   multivitamin with minerals Tabs tablet Take 1 tablet by mouth daily.   niacin 500 MG tablet Take 1,000 mg by mouth 2 (two) times daily.   omeprazole 20 MG capsule Commonly known as:  PRILOSEC Take 40 mg by mouth daily.   ondansetron 4 MG disintegrating tablet Commonly known as:  ZOFRAN ODT Take 1 tablet (4 mg total) by mouth every 8 (eight) hours as needed for nausea or vomiting.   ranitidine 150 MG tablet Commonly known as:  ZANTAC Take 150 mg by mouth 2 (two) times daily as needed for heartburn.   sotalol 80 MG tablet Commonly known as:  BETAPACE Take 1 tablet (80 mg total) by mouth daily. What changed:  how to take this  when to take this   sulfaSALAzine 500 MG tablet Commonly known as:  AZULFIDINE Take 1,000 mg by mouth 2 (two) times daily.   tamsulosin 0.4 MG Caps capsule Commonly known as:  FLOMAX Take 1 capsule by mouth daily.   vitamin C 1000 MG tablet Take 1,000 mg by mouth 3 (three) times daily.   VITAMIN E PO Take 1 capsule by mouth 2 (two) times daily.            Discharge Care Instructions        Start     Ordered   06/23/17 0000  sotalol (BETAPACE) 80 MG tablet  Daily     06/23/17 1436   06/23/17 0000  insulin aspart (NOVOLOG) 100 UNIT/ML injection  3 times daily with meals     06/23/17 1436   06/23/17 0000  insulin aspart (NOVOLOG) 100 UNIT/ML injection     06/23/17 1436   06/23/17 0000  Activity as tolerated -  No restrictions     06/23/17 1436   06/23/17 0000  Diet - low sodium heart healthy     06/23/17 1436   06/23/17 0000  Diet Carb Modified     06/23/17 1436   06/23/17 0000  blood glucose meter kit and supplies KIT    Question Answer Comment  Number of strips 120   Number of lancets 120      06/23/17 1436   06/23/17 0000  Ambulatory referral to Nutrition and Diabetic Education    Comments:  Patient admitted with Hypoglycemia.  Requesting visit with CDE for further DM education.  Wants to try to get appt with Dr. Gabriel Carina with [facility].  Takes insulin  at home.  Wife's cell phone Number: [phone]  Patient: Please contact [facility] if you do not hear from the center after discharge.   06/23/17 1455        DISCHARGE INSTRUCTIONS:   DIET:  Cardiac diet and Diabetic diet  DISCHARGE CONDITION:  Stable  ACTIVITY:  Activity as tolerated  OXYGEN:  Home Oxygen: No.   Oxygen Delivery: room air  DISCHARGE LOCATION:  home   If you experience worsening of your admission symptoms, develop shortness of breath, life threatening emergency, suicidal or homicidal thoughts you must seek medical attention immediately by calling 911 or calling your MD immediately  if symptoms less severe.  You Must read complete instructions/literature along with all the possible adverse reactions/side effects for all the Medicines you take and that have been prescribed to you. Take any new Medicines after you have completely understood and accpet all the possible adverse reactions/side effects.   Please note  You were cared for by a hospitalist during your hospital stay. If you have any questions about your discharge medications or the care you received while you were in the hospital after you are discharged, you can call the unit and asked to speak with the hospitalist on call if the hospitalist that took care of you is not available. Once you are discharged, your primary care  physician will handle any further medical issues. Please note that NO REFILLS for any discharge medications will be authorized once you are discharged, as it is imperative that you return to your primary care physician (or establish a relationship with a primary care physician if you do not have one) for your aftercare needs so that they can reassess your need for medications and monitor your lab values.     Today   Hypothermia resolved, no further episodes of hypoglycemia. Clinically asymptomatic. Heart rates remained on the low side but hemodynamically stable. We'll discharge home today.  VITAL SIGNS:  Blood pressure (!) 115/52, pulse (!) 50, temperature 98.2 F (36.8 C), temperature source Oral, resp. rate 14, height _0  (1.854 m), weight 81 kg (178 lb 8 oz), SpO2 98 %.  I/O:   Intake/Output Summary (Last 24 hours) at 06/23/17 1456 Last data filed at 06/23/17 1353  Gross per 24 hour  Intake              843 ml  Output             1725 ml  Net             -882 ml    PHYSICAL EXAMINATION:  GENERAL:  71 y.o.-year-old patient lying in the bed with no acute distress.  EYES: Pupils equal, round, reactive to light and accommodation. No scleral icterus. Extraocular muscles intact.  HEENT: Head atraumatic, normocephalic. Oropharynx and nasopharynx clear.  NECK:  Supple, no jugular venous distention. No thyroid enlargement, no tenderness.  LUNGS: Normal breath sounds bilaterally, no wheezing, rales,rhonchi. No use of accessory muscles of respiration.  CARDIOVASCULAR: S1, S2 normal. No murmurs, rubs, or gallops.  ABDOMEN: Soft, non-tender, non-distended. Bowel sounds present. No organomegaly or mass.  EXTREMITIES: No pedal edema, cyanosis, or clubbing.  NEUROLOGIC: Cranial nerves II through XII are intact. No focal motor or sensory defecits b/l.  PSYCHIATRIC: The patient is alert and oriented x 3.  SKIN: No obvious rash, lesion, or ulcer.   DATA REVIEW:   CBC  Recent Labs Lab  06/23/17 0014  WBC 4.5  HGB 11.6*  HCT  34.5*  PLT 77*    Chemistries   Recent Labs Lab 06/22/17 1255 06/23/17 0014  NA 139 133*  K 4.9 4.3  CL 101 103  CO2 27 25  GLUCOSE 73 232*  BUN 26* 23*  CREATININE 1.09 1.05  CALCIUM 9.0 8.2*  MG 1.8  --   AST 54*  --   ALT 33  --   ALKPHOS 175*  --   BILITOT 1.6*  --     Cardiac Enzymes  Recent Labs Lab 06/22/17 1255  TROPONINI <0.03    Microbiology Results  Results for orders placed or performed during the hospital encounter of 06/22/17  Blood Culture (routine x 2)     Status: None (Preliminary result)   Collection Time: 06/22/17 12:55 PM  Result Value Ref Range Status   Specimen Description BLOOD RIGHT [facility]  Final   Special Requests   Final    BOTTLES DRAWN AEROBIC AND ANAEROBIC Blood Culture adequate volume   Culture NO GROWTH < 24 HOURS  Final   Report Status PENDING  Incomplete  Blood Culture (routine x 2)     Status: None (Preliminary result)   Collection Time: 06/22/17 12:55 PM  Result Value Ref Range Status   Specimen Description BLOOD LEFT ANTECUBITAL  Final   Special Requests   Final    BOTTLES DRAWN AEROBIC AND ANAEROBIC Blood Culture results may not be optimal due to an excessive volume of blood received in culture bottles   Culture NO GROWTH < 24 HOURS  Final   Report Status PENDING  Incomplete    RADIOLOGY:  Ct Head Wo Contrast  Result Date: 06/22/2017 CLINICAL DATA:  Altered level of consciousness. EXAM: CT HEAD WITHOUT CONTRAST TECHNIQUE: Contiguous axial images were obtained from the base of the skull through the vertex without intravenous contrast. COMPARISON:  None. FINDINGS: Brain: No evidence of acute infarction, hemorrhage, extra-axial collection, ventriculomegaly, or mass effect. Generalized cerebral atrophy. Periventricular white matter low attenuation likely secondary to microangiopathy. Vascular: Cerebrovascular atherosclerotic calcifications are noted. Skull: Negative for fracture or focal  lesion. Sinuses/Orbits: Visualized portions of the orbits are unremarkable. Visualized portions of the paranasal sinuses and mastoid air cells are unremarkable. Other: None. IMPRESSION: 1. No acute intracranial pathology. 2. Chronic microvascular disease and cerebral atrophy. Electronically Signed   By: Kathreen Devoid   On: 06/22/2017 13:20   Dg Chest Port 1 View  Result Date: 06/22/2017 CLINICAL DATA:  Hypothermia EXAM: PORTABLE CHEST 1 VIEW COMPARISON:  10/18/2014 FINDINGS: Normal heart size and stable mild aortic tortuosity. There is no edema, consolidation, effusion, or pneumothorax. Remote posterior left seventh rib fracture. IMPRESSION: No evidence of active disease. Electronically Signed   By: Monte Fantasia M.D.   On: 06/22/2017 13:24      Management plans discussed with the patient, family and they are in agreement.  CODE STATUS:     Code Status Orders        Start     Ordered   06/22/17 1456  Full code  Continuous     06/22/17 1457    Code Status History    Date Active Date Inactive Code Status Order ID Comments User Context   08/19/2015  4:02 PM 08/20/2015  6:30 PM Full Code [id number]  Demetrios Loll, MD Inpatient    Advance Directive Documentation     Most Recent Value  Type of Advance Directive  Healthcare Power of Attorney, Living will  Pre-existing out of facility DNR order (yellow form or pink MOST form)  -  "  MOST" Form in Place?  -      TOTAL TIME TAKING CARE OF THIS PATIENT: 40 minutes.    Henreitta Leber M.D on 06/23/2017 at 2:56 PM  Between 7am to 6pm - Pager - [phone]  After 6pm go to [email or web address] - Proofreader  [facility]  Office  [phone]  CC: Primary care physician; System, Pcp Not In

## 2017-06-23 NOTE — Progress Notes (Signed)
Pt be discharged home with wife. IV & tele monitor removed. Discharge instructions and diabetes education and materials provided and reviewed with the patient and his wife. All questions answered and pt has no further questions or complaints. VSS. Pt ambulated to vehicle.

## 2017-06-23 NOTE — Progress Notes (Signed)
Met with pt this afternoon.  Wife and daughter at bedside.  Patient and family had many questions about pt's DM care at home.  Would like to go see CDE at the [facility] after d/c.  Orders placed for [facility] DM education [facility].    Discussed NPH and Regular Insulins with pt and family.  Discussed how they work, onset, duration, peaks, etc.  Pt stated he has been taking insulin a long time now.  Often guesses at how much Regular insulin to take with meals.  Does not have scale and does not carb count at home.  Will often take extra insulin when CBGs are high.    Has not had any Hypoglycemic events at home that he knows of (other than what brought him to the hospital).  However, pt admits to not checking his CBGs on a regular basis.  Sometimes only checks BID.  Complains of feelings of fatigue bit does not endorse other Hypoglycemic symptoms like sweating, dizziness, or shakiness.  Discussed A1C results with patient and family and explained what an A1C is, basic pathophysiology of DM, basic home care, basic diabetes diet nutrition principles, importance of checking CBGs and maintaining good CBG control to prevent long-term and short-term complications.  Reviewed signs and symptoms of hyperglycemia and hypoglycemia and how to treat hypoglycemia at home.  Also reviewed blood sugar goals and A1c goals for home.    Patient stated he would like a Rx for a new CBG meter as his meter is 74 years old.  Daughter questioning accuracy of old meter and states she would like for her dad to have a new meter.    Encouraged patient to seek follow up with Dr. Gabriel Carina (Endocrinologist) with [facility].  Encouraged pt to ask Dr. Gabriel Carina (if he gets appt) about the possibility of starting a Freestyle Libre continuous glucose meter.  This would allow pt to have much more blood sugar data to help him determine if he is having Hypoglycemia at home.  Not sure why patient taking older generation insulins?   May do better with insulin analogs like Levemir or Lantus and Novolog.  Discussed with pt and family about switching to Novolog insulin from Regular.  Discussed how to take set dose of Novolog for food coverage and how to use Novolog SSI to cover elevated CBGs.  Patient stated he would be able to do this at home and agreed to change to Novolog.  Discussed with Dr. Verdell Carmine and orders to changed to Novolog 4 units TID with meals + Novolog SSI regimen TID with meals for home use.  Patient to address switching to Levemir or Lantus with Dr. Gabriel Carina when he gets appt.     --Will follow patient during hospitalization--  Wyn Quaker RN, MSN, CDE Diabetes Coordinator Inpatient Glycemic Control Team Team Pager: [phone] (8a-5p)

## 2017-06-23 NOTE — Progress Notes (Signed)
Inpatient Diabetes Program Recommendations  AACE/ADA: New Consensus Statement on Inpatient Glycemic Control (2015)  Target Ranges:  Prepandial:   less than 140 mg/dL      Peak postprandial:   less than 180 mg/dL (1-2 hours)      Critically ill patients:  140 - 180 mg/dL   Results for WISE, FEES (MRN [id number]) as of 06/23/2017 12:25  Ref. Range 06/22/2017 12:28 06/22/2017 13:49 06/22/2017 18:18 06/22/2017 21:41 06/23/2017 07:47 06/23/2017 11:18  Glucose-Capillary Latest Ref Range: 65 - 99 mg/dL 68 137 (H) 141 (H) 252 (H) 150 (H)  1 unit Novolog 307 (H)  7 units Novolog    Admit Increased Lactic Acid/ Hypothermia/ Hypoglycemia.   History: DM, Recent Diagnosis of Pancytopenia (bone marrow bx normal)  Home DM meds: NPH 16 units BID        Regular 6-10 units QID       Metformin 500 mg BID  Current Orders: Levemir 16 units BID     Novolog Sensitive Correction Scale/ SSI (0-9 units) TID AC + HS       Note Levemir insulin just started this AM.  CBG quite high after breakfast today.  Patient ate 100% this AM.  MD- Please consider the following in-hospital insulin adjustments:  Start Novolog Meal Coverage: Novolog 4 units TID with meals (hold if pt eats <50% of meal)     --Will follow patient during hospitalization--  Wyn Quaker RN, MSN, CDE Diabetes Coordinator Inpatient Glycemic Control Team Team Pager: [phone] (8a-5p)

## 2017-06-23 NOTE — Progress Notes (Signed)
Chaplain received OR for Advanced Directives. CH met pt and wife at bedside and educated pt about AD. [place] reviewed material and indicated that he was ready to complete AD. [place] contacted a Insurance account manager and gathered 2 witnesses and had pt complete AD. CH gave original and a copy to pt, and place another copy in pt's chart.    06/23/17 1100  Clinical Encounter Type  Visited With Patient and family together  Visit Type Initial;Follow-up;Other (Comment)  Referral From Nurse  Consult/Referral To Chaplain  Spiritual Encounters  Spiritual Needs Literature;Other (Comment)

## 2017-06-27 LAB — CULTURE, BLOOD (ROUTINE X 2)
CULTURE: NO GROWTH
CULTURE: NO GROWTH
SPECIAL REQUESTS: ADEQUATE

## 2017-07-01 ENCOUNTER — Encounter: Payer: Medicare Other | Attending: Specialist | Admitting: *Deleted

## 2017-07-01 ENCOUNTER — Encounter: Payer: Self-pay | Admitting: *Deleted

## 2017-07-01 VITALS — BP 104/60 | Ht 73.0 in | Wt 184.2 lb

## 2017-07-01 DIAGNOSIS — Z794 Long term (current) use of insulin: Secondary | ICD-10-CM | POA: Diagnosis not present

## 2017-07-01 DIAGNOSIS — E119 Type 2 diabetes mellitus without complications: Secondary | ICD-10-CM

## 2017-07-01 DIAGNOSIS — Z713 Dietary counseling and surveillance: Secondary | ICD-10-CM | POA: Insufficient documentation

## 2017-07-01 DIAGNOSIS — E11649 Type 2 diabetes mellitus with hypoglycemia without coma: Secondary | ICD-10-CM | POA: Insufficient documentation

## 2017-07-01 DIAGNOSIS — Z6824 Body mass index (BMI) 24.0-24.9, adult: Secondary | ICD-10-CM | POA: Insufficient documentation

## 2017-07-01 NOTE — Patient Instructions (Signed)
Check blood sugars 4 x day before each meal and before bed every day Bring blood sugar records to the next class  Exercise: Continue walking for 15-20  minutes  1-2 days a week and gradually increase to 30 minutes 5 x week  Eat 3 meals day,  1-2 snacks a day Space meals 4-6 hours apart Don't skip meals  Make an eye doctor appointment  Carry fast acting glucose and a snack at all times Rotate injection sites  Return for classes on:

## 2017-07-02 NOTE — Progress Notes (Signed)
Diabetes Self-Management Education  Visit Type: First/Initial  Appt. Start Time: 1535 Appt. End Time: 1610  07/01/2017  Mr. Bernard Pennington, identified by name and date of birth, is a 71 y.o. male with a diagnosis of Diabetes: Type 2.   ASSESSMENT  Blood pressure 104/60, height 6\' 1"  (1.854 m), weight 184 lb 3.2 oz (83.6 kg). Body mass index is 24.3 kg/m.      Diabetes Self-Management Education - 07/01/17 1731      Visit Information   Visit Type First/Initial     Initial Visit   Diabetes Type Type 2   Are you currently following a meal plan? Yes   What type of meal plan do you follow? Gluten free   Are you taking your medications as prescribed? Yes   Date Diagnosed 15 years ago     Health Coping   How would you rate your overall health? Good;Fair     Psychosocial Assessment   Patient Belief/Attitude about Diabetes Motivated to manage diabetes   Self-care barriers None   Self-management support Doctor's office;Family   Other persons present Spouse/SO   Patient Concerns Nutrition/Meal planning;Medication;Monitoring;Glycemic Control;Healthy Lifestyle   Special Needs None   Preferred Learning Style Auditory;Visual;Hands on   [place] in progress   How often do you need to have someone help you when you read instructions, pamphlets, or other written materials from your doctor or pharmacy? 1 - Never   What is the last grade level you completed in school? 2 years college     Pre-Education Assessment   Patient understands the diabetes disease and treatment process. Needs Instruction   Patient understands incorporating nutritional management into lifestyle. Needs Instruction   Patient undertands incorporating physical activity into lifestyle. Needs Review   Patient understands using medications safely. Needs Review   Patient understands monitoring blood glucose, interpreting and using results Needs Review   Patient understands prevention, detection, and  treatment of acute complications. Needs Review   Patient understands prevention, detection, and treatment of chronic complications. Needs Review   Patient understands how to develop strategies to address psychosocial issues. Needs Instruction   Patient understands how to develop strategies to promote health/change behavior. Needs Instruction     Complications   Last HgB A1C per patient/outside source 5.4 %  06/22/17   How often do you check your blood sugar? 3-4 times/day   Fasting Blood glucose range (mg/dL) <70;>200;70-129;180-200;130-179  FBG's 61-291 mg/dL   Postprandial Blood glucose range (mg/dL) 130-179;180-200;>200  pp's 172-407 mg/dL; readings before lunch 119-436 mg/dL; before supper 75-404 mg/dL; at bedtime 133-434 mg/dL   Number of hypoglycemic episodes per month 2; pt was hospitalized last month with hypoglycemia   Can you tell when your blood sugar is low?  Yes   What do you do if your blood sugar is low? Eats something   Have you had a dilated eye exam in the past 12 months? No   Have you had a dental exam in the past 12 months? Yes   Are you checking your feet? Yes   How many days per week are you checking your feet? 7     Dietary Intake   Breakfast 2 toasted cheese sandwiches with peppers and tomatoes; eggs, bacon and pancakes   Lunch spinach wraps, lunch meat, chicken, vegetables, sandwich and soup   Dinner chili, rice, corn chips; soup and sandwich; sauerkraut and beans; chicken or pork or beef or fish; potatoes, corn beans, non starchy vegetables   Snack (evening) sledom  candy , cookies, ice cream   Beverage(s) water, unsweetened tea, coffee, diet soda     Exercise   Exercise Type Light (walking / raking leaves)   How many days per week to you exercise? 2   How many minutes per day do you exercise? 15   Total minutes per week of exercise 30     Patient Education   Previous Diabetes Education Yes (please comment)  at [facility] when first diagnosed   Disease state   Definition of diabetes, type 1 and 2, and the diagnosis of diabetes;Factors that contribute to the development of diabetes   Nutrition management  Role of diet in the treatment of diabetes and the relationship between the three main macronutrients and blood glucose level;Food label reading, portion sizes and measuring food.;Reviewed blood glucose goals for pre and post meals and how to evaluate the patients' food intake on their blood glucose level.;Meal timing in regards to the patients' current diabetes medication.   Physical activity and exercise  Role of exercise on diabetes management, blood pressure control and cardiac health.   Medications Taught/reviewed insulin injection, site rotation, insulin storage and needle disposal.;Reviewed patients medication for diabetes, action, purpose, timing of dose and side effects.   Monitoring Purpose and frequency of SMBG.;Taught/discussed recording of test results and interpretation of SMBG.;Identified appropriate SMBG and/or A1C goals.;Yearly dilated eye exam   Acute complications Taught treatment of hypoglycemia - the 15 rule.   Chronic complications Relationship between chronic complications and blood glucose control   Psychosocial adjustment Identified and addressed patients feelings and concerns about diabetes     Individualized Goals (developed by patient)   Reducing Risk Improve blood sugars Decrease medications Prevent diabetes complications Lead a healthier lifestyle Become more fit     Outcomes   Expected Outcomes Demonstrated interest in learning. Expect positive outcomes   Future DMSE 2 wks      Individualized Plan for Diabetes Self-Management Training:   Learning Objective:  Patient will have a greater understanding of diabetes self-management. Patient education plan is to attend individual and/or group sessions per assessed needs and concerns.   Plan:   Patient Instructions  Check blood sugars 4 x day before each meal and before  bed every day Bring blood sugar records to the next class Exercise: Continue walking for 15-20  minutes  1-2 days a week and gradually increase to 30 minutes 5 x week Eat 3 meals day,  1-2 snacks a day Space meals 4-6 hours apart Don't skip meals Make an eye doctor appointment Carry fast acting glucose and a snack at all times Rotate injection sites   Expected Outcomes:  Demonstrated interest in learning. Expect positive outcomes  Education material provided:  General Meal Planning Guidelines Simple Meal Plan Glucose tablets Symptoms, causes and treatments of Hypoglycemia  If problems or questions, patient to contact team via:  Johny Drilling, [place], [place], CDE [phone]  Future DSME appointment: 2 wks  July 19, 2017 for Diabetes Class 1

## 2017-07-19 ENCOUNTER — Encounter: Payer: Self-pay | Admitting: Dietician

## 2017-07-19 ENCOUNTER — Encounter: Payer: Medicare Other | Admitting: Dietician

## 2017-07-19 VITALS — Ht 73.0 in | Wt 184.3 lb

## 2017-07-19 DIAGNOSIS — E119 Type 2 diabetes mellitus without complications: Secondary | ICD-10-CM

## 2017-07-19 DIAGNOSIS — E11649 Type 2 diabetes mellitus with hypoglycemia without coma: Secondary | ICD-10-CM | POA: Diagnosis not present

## 2017-07-19 DIAGNOSIS — Z794 Long term (current) use of insulin: Secondary | ICD-10-CM

## 2017-07-19 NOTE — Progress Notes (Signed)

## 2017-07-26 ENCOUNTER — Encounter: Payer: Self-pay | Admitting: Dietician

## 2017-07-26 ENCOUNTER — Encounter: Payer: Medicare Other | Admitting: Dietician

## 2017-07-26 VITALS — Wt 183.7 lb

## 2017-07-26 DIAGNOSIS — Z794 Long term (current) use of insulin: Secondary | ICD-10-CM

## 2017-07-26 DIAGNOSIS — E11649 Type 2 diabetes mellitus with hypoglycemia without coma: Secondary | ICD-10-CM | POA: Diagnosis not present

## 2017-07-26 DIAGNOSIS — E119 Type 2 diabetes mellitus without complications: Secondary | ICD-10-CM

## 2017-07-26 NOTE — Progress Notes (Signed)

## 2017-07-28 ENCOUNTER — Ambulatory Visit: Admitting: Internal Medicine

## 2017-08-02 ENCOUNTER — Encounter: Payer: Self-pay | Admitting: Dietician

## 2017-08-02 ENCOUNTER — Encounter: Payer: Medicare Other | Attending: Specialist | Admitting: Dietician

## 2017-08-02 VITALS — BP 122/50 | Ht 73.0 in | Wt 183.0 lb

## 2017-08-02 DIAGNOSIS — Z794 Long term (current) use of insulin: Secondary | ICD-10-CM | POA: Insufficient documentation

## 2017-08-02 DIAGNOSIS — Z6824 Body mass index (BMI) 24.0-24.9, adult: Secondary | ICD-10-CM | POA: Insufficient documentation

## 2017-08-02 DIAGNOSIS — E119 Type 2 diabetes mellitus without complications: Secondary | ICD-10-CM

## 2017-08-02 DIAGNOSIS — Z713 Dietary counseling and surveillance: Secondary | ICD-10-CM | POA: Diagnosis not present

## 2017-08-02 DIAGNOSIS — E11649 Type 2 diabetes mellitus with hypoglycemia without coma: Secondary | ICD-10-CM | POA: Insufficient documentation

## 2017-08-02 NOTE — Progress Notes (Signed)

## 2017-08-10 ENCOUNTER — Encounter: Payer: Self-pay | Admitting: *Deleted

## 2017-08-24 ENCOUNTER — Encounter: Payer: Self-pay | Admitting: *Deleted

## 2017-08-24 ENCOUNTER — Other Ambulatory Visit: Payer: Self-pay

## 2017-08-24 ENCOUNTER — Emergency Department
Admission: EM | Admit: 2017-08-24 | Discharge: 2017-08-24 | Disposition: A | Discharge status: Home / Self Care | Payer: Medicare Other | Attending: Emergency Medicine | Admitting: Emergency Medicine

## 2017-08-24 DIAGNOSIS — K2960 Other gastritis without bleeding: Secondary | ICD-10-CM | POA: Diagnosis not present

## 2017-08-24 DIAGNOSIS — Z8673 Personal history of transient ischemic attack (TIA), and cerebral infarction without residual deficits: Secondary | ICD-10-CM | POA: Insufficient documentation

## 2017-08-24 DIAGNOSIS — R112 Nausea with vomiting, unspecified: Secondary | ICD-10-CM | POA: Diagnosis present

## 2017-08-24 DIAGNOSIS — Z87891 Personal history of nicotine dependence: Secondary | ICD-10-CM | POA: Insufficient documentation

## 2017-08-24 DIAGNOSIS — Z794 Long term (current) use of insulin: Secondary | ICD-10-CM | POA: Insufficient documentation

## 2017-08-24 DIAGNOSIS — I10 Essential (primary) hypertension: Secondary | ICD-10-CM | POA: Insufficient documentation

## 2017-08-24 DIAGNOSIS — Z79899 Other long term (current) drug therapy: Secondary | ICD-10-CM | POA: Diagnosis not present

## 2017-08-24 DIAGNOSIS — K2900 Acute gastritis without bleeding: Secondary | ICD-10-CM

## 2017-08-24 LAB — URINALYSIS, COMPLETE (UACMP) WITH MICROSCOPIC
BACTERIA UA: NONE SEEN
BILIRUBIN URINE: NEGATIVE
Glucose, UA: NEGATIVE mg/dL
KETONES UR: 20 mg/dL — AB
Nitrite: NEGATIVE
PROTEIN: NEGATIVE mg/dL
Specific Gravity, Urine: 1.011 (ref 1.005–1.030)
pH: 5 (ref 5.0–8.0)

## 2017-08-24 LAB — COMPREHENSIVE METABOLIC PANEL
ALT: 54 U/L (ref 17–63)
ANION GAP: 17 — AB (ref 5–15)
AST: 83 U/L — AB (ref 15–41)
Albumin: 3.8 g/dL (ref 3.5–5.0)
Alkaline Phosphatase: 166 U/L — ABNORMAL HIGH (ref 38–126)
BUN: 23 mg/dL — AB (ref 6–20)
CHLORIDE: 103 mmol/L (ref 101–111)
CO2: 17 mmol/L — ABNORMAL LOW (ref 22–32)
Calcium: 9.1 mg/dL (ref 8.9–10.3)
Creatinine, Ser: 1.31 mg/dL — ABNORMAL HIGH (ref 0.61–1.24)
GFR calc Af Amer: >60 mL/min (ref >60)
GFR, EST NON AFRICAN AMERICAN: 52 mL/min — AB (ref >60)
Glucose, Bld: 127 mg/dL — ABNORMAL HIGH (ref 65–99)
POTASSIUM: 5.4 mmol/L — AB (ref 3.5–5.1)
Sodium: 137 mmol/L (ref 135–145)
Total Bilirubin: 1.8 mg/dL — ABNORMAL HIGH (ref 0.3–1.2)
Total Protein: 6.9 g/dL (ref 6.5–8.1)

## 2017-08-24 LAB — BASIC METABOLIC PANEL
ANION GAP: 13 (ref 5–15)
BUN: 22 mg/dL — ABNORMAL HIGH (ref 6–20)
CALCIUM: 8.5 mg/dL — AB (ref 8.9–10.3)
CO2: 19 mmol/L — ABNORMAL LOW (ref 22–32)
CREATININE: 1.07 mg/dL (ref 0.61–1.24)
Chloride: 105 mmol/L (ref 101–111)
Glucose, Bld: 137 mg/dL — ABNORMAL HIGH (ref 65–99)
Potassium: 4.7 mmol/L (ref 3.5–5.1)
Sodium: 137 mmol/L (ref 135–145)

## 2017-08-24 LAB — CBC
HEMATOCRIT: 39.4 % — AB (ref 40.0–52.0)
HEMOGLOBIN: 13 g/dL (ref 13.0–18.0)
MCH: 34.7 pg — ABNORMAL HIGH (ref 26.0–34.0)
MCHC: 33.1 g/dL (ref 32.0–36.0)
MCV: 104.8 fL — ABNORMAL HIGH (ref 80.0–100.0)
Platelets: 149 10*3/uL — ABNORMAL LOW (ref 150–440)
RBC: 3.76 MIL/uL — AB (ref 4.40–5.90)
RDW: 16.6 % — AB (ref 11.5–14.5)
WBC: 4.1 10*3/uL (ref 3.8–10.6)

## 2017-08-24 LAB — LIPASE, BLOOD: LIPASE: 26 U/L (ref 11–51)

## 2017-08-24 MED ORDER — ONDANSETRON HCL 4 MG/2ML IJ SOLN
INTRAMUSCULAR | Status: AC
  Administered 2017-08-24: 4 mg via INTRAVENOUS
  Filled 2017-08-24: qty 2

## 2017-08-24 MED ORDER — GI COCKTAIL ~~LOC~~
30.0000 mL | Freq: Once | ORAL | Status: AC
  Administered 2017-08-24: 30 mL via ORAL

## 2017-08-24 MED ORDER — GI COCKTAIL ~~LOC~~
ORAL | Status: AC
  Filled 2017-08-24: qty 30

## 2017-08-24 MED ORDER — METOCLOPRAMIDE HCL 5 MG/ML IJ SOLN
10.0000 mg | Freq: Once | INTRAMUSCULAR | Status: AC
  Administered 2017-08-24: 10 mg via INTRAVENOUS
  Filled 2017-08-24: qty 2

## 2017-08-24 MED ORDER — METOCLOPRAMIDE HCL 5 MG PO TABS: 5.0000 mg | ORAL_TABLET | Freq: Three times a day (TID) | ORAL | 0 refills | Status: DC | PRN

## 2017-08-24 MED ORDER — SODIUM CHLORIDE 0.9 % IV SOLN
1000.0000 mL | Freq: Once | INTRAVENOUS | Status: AC
  Administered 2017-08-24: 1000 mL via INTRAVENOUS

## 2017-08-24 MED ORDER — MORPHINE SULFATE (PF) 2 MG/ML IV SOLN
INTRAVENOUS | Status: AC
  Filled 2017-08-24: qty 1

## 2017-08-24 MED ORDER — MORPHINE SULFATE (PF) 2 MG/ML IV SOLN
2.0000 mg | Freq: Once | INTRAVENOUS | Status: AC
  Administered 2017-08-24: 2 mg via INTRAVENOUS

## 2017-08-24 MED ORDER — ONDANSETRON HCL 4 MG/2ML IJ SOLN
4.0000 mg | Freq: Once | INTRAMUSCULAR | Status: AC
  Administered 2017-08-24: 4 mg via INTRAVENOUS

## 2017-08-24 NOTE — ED Triage Notes (Signed)
Pt states he started drinking clear liquids in preparation for a routine colonoscopy tomorrow and last night began having abd pain with N/V all night and into today.Bernard Pennington

## 2017-08-24 NOTE — ED Provider Notes (Signed)
[facility] Emergency Department Provider Note   ____________________________________________    I have reviewed the triage vital signs and the nursing notes.   HISTORY  Chief Complaint Emesis and Abdominal Pain     HPI Bernard Pennington is a 71 y.o. male who presents with complaints of nausea and vomiting and abdominal cramping.  Patient reports he is prepping for a colonoscopy, he reports a history of frequent colonoscopies and typically he drinks clear liquids prior to the bowel prep because he wants to be completely "cleaned out".  Reports yesterday he was drinking cold water and felt his stomach spasm and he began having nausea and reports this is happened in the past and required an ED visit.  At that time he was given antiemetics which did help him significantly.  Currently complains primarily of nausea and vomiting and dry heaving but no significant abdominal pain.  No fevers or chills.  No diarrhea.  Does have a history of diabetes   Past Medical History:  Diagnosis Date  . A-fib ([place])   . Achalasia   . BPH (benign prostatic hyperplasia)   . Chronic kidney disease    kidney stones  . Cirrhosis ([place])   . Constipation   . Diabetes mellitus without complication ([place])   . Dysrhythmia    a-fib  . Esophageal varices ()   . GERD (gastroesophageal reflux disease)   . Headache   . Hyperlipemia   . Hypertension   . Pancytopenia ([place])   . TIA (transient ischemic attack)   . Ulcerative colitis [facility])     Patient Active Problem List   Diagnosis Date Noted  . Hypoglycemia 06/22/2017  . TIA (transient ischemic attack) 08/19/2015    Past Surgical History:  Procedure Laterality Date  . CHOLECYSTECTOMY    . JOINT REPLACEMENT     L partial knee  . KIDNEY SURGERY    . LITHOTRIPSY    . VASECTOMY      Prior to Admission medications   Medication Sig Start Date End Date Taking? Authorizing Provider  apixaban (ELIQUIS) 5 MG TABS tablet Take  5 mg by mouth every 12 (twelve) hours.   Yes [provider]  Ascorbic Acid (VITAMIN C) 1000 MG tablet Take 1,000 mg by mouth 3 (three) times daily.   Yes [provider]  CINNAMON PO Take 1 tablet by mouth 3 (three) times daily before meals.   Yes [provider]  cloNIDine (CATAPRES) 0.1 MG tablet Take 0.1 mg by mouth 2 (two) times daily.    Yes [provider]  gabapentin (NEURONTIN) 300 MG capsule Take 900 mg by mouth 3 (three) times daily.   Yes [provider]  insulin aspart (NOVOLOG) 100 UNIT/ML injection Inject 4 Units into the skin 3 (three) times daily with meals. 06/23/17  Yes Sainani, Belia Heman, MD  insulin NPH Human (HUMULIN N,NOVOLIN N) 100 UNIT/ML injection Inject 16 Units into the skin 2 (two) times daily.   Yes [provider]  magnesium oxide (MAG-OX) 400 MG tablet Take 400 mg by mouth 2 (two) times daily.   Yes [provider]  metFORMIN (GLUCOPHAGE) 500 MG tablet Take 500 mg by mouth 2 (two) times daily with a meal.   Yes [provider]  Multiple Vitamin (MULTIVITAMIN WITH MINERALS) TABS tablet Take 1 tablet by mouth daily.   Yes [provider]  niacin 500 MG tablet Take 1,000 mg by mouth 2 (two) times daily.   Yes [provider]  Omega-3 Fatty Acids (FISH OIL) 1000 MG CAPS Take 1,000 mg by mouth daily.   Yes [provider]  omeprazole (PRILOSEC) 20 MG capsule Take 40 mg by mouth daily.   Yes [provider]  sulfaSALAzine (AZULFIDINE) 500 MG tablet Take 1,000 mg by mouth 2 (two) times daily.   Yes [provider]  tamsulosin (FLOMAX) 0.4 MG CAPS capsule Take 1 capsule by mouth daily.   Yes [provider]  VITAMIN E PO Take 1 capsule by mouth 2 (two) times daily.   Yes [provider]  blood glucose meter kit and supplies KIT Dispense based on patient and insurance preference. Use up to four times daily as directed. (FOR ICD-9 250.00, 250.01).  06/23/17   Henreitta Leber, MD  insulin aspart (NOVOLOG) 100 UNIT/ML injection TID w/ meals.    BS < 120 - 0 units BS 121-150 - 1 unit SQ BS 151-200 - 2 units SQ BS 201-250 - 3 units SQ BS 251-300 - 4 units SQ BS 301-350 - 5 units SQ BS 351-400 - 6 units SQ   > 400 Call PCP. 06/23/17   Henreitta Leber, MD  metoCLOPramide (REGLAN) 5 MG tablet Take 1 tablet (5 mg total) by mouth every 8 (eight) hours as needed for nausea. 08/24/17 08/24/18  Lavonia Drafts, MD  ondansetron (ZOFRAN ODT) 4 MG disintegrating tablet Take 1 tablet (4 mg total) by mouth every 8 (eight) hours as needed for nausea or vomiting. 03/10/17   Merlyn Lot, MD  ranitidine (ZANTAC) 150 MG tablet Take 150 mg by mouth 2 (two) times daily as needed for heartburn.    [provider]  sotalol (BETAPACE) 80 MG tablet Take 1 tablet (80 mg total) by mouth daily. 06/23/17   Henreitta Leber, MD     Allergies Aspirin  Family History  Problem Relation Age of Onset  . Heart attack Mother   . Stroke Father   . Cancer Father   . Diabetes Brother     Social History Social History   Tobacco Use  . Smoking status: Former Smoker    Packs/day: 4.00    Years: 25.00    Pack years: 100.00    Types: Cigarettes    Last attempt to quit: 09/28/1981    Years since quitting: 35.9  . Smokeless tobacco: Never Used  . Tobacco comment: Quit about 40 years ago for about 16 years  Substance Use Topics  . Alcohol use: No    Comment: used to have anightly drink but stopped 1 yr ago  . Drug use: No    Review of Systems  Constitutional: No fever/chills Eyes: No visual changes.  ENT: No sore throat. Cardiovascular: Denies chest pain. Respiratory: Denies shortness of breath. Gastrointestinal: As above Genitourinary: Negative for dysuria. Musculoskeletal: Negative for back pain. Skin: Negative for rash. Neurological: Negative for headaches or weakness   ____________________________________________   PHYSICAL  EXAM:  VITAL SIGNS: ED Triage Vitals  Enc Vitals Group     BP 08/24/17 1044 (!) 154/132     Pulse Rate 08/24/17 1044 (!) 102     Resp --      Temp 08/24/17 1044 98 F (36.7 C)     Temp Source 08/24/17 1044 Axillary     SpO2 08/24/17 1044 100 %     Weight 08/24/17 1045 79.4 kg (175 lb)     Height 08/24/17 1045 1.854 m (6' 1" )     Head Circumference --  Peak Flow --      Pain Score 08/24/17 1044 5     Pain Loc --      Pain Edu? --      Excl. in [place]? --     Constitutional: Alert and oriented. No acute distress.  Anxious Eyes: Conjunctivae are normal.   Nose: No congestion/rhinnorhea. Mouth/Throat: Mucous membranes are moist.    Cardiovascular: Normal rate, regular rhythm. Grossly normal heart sounds.  Good peripheral circulation. Respiratory: Normal respiratory effort.  No retractions. Lungs CTAB. Gastrointestinal: Soft and nontender. No distention.  No CVA tenderness. Genitourinary: deferred Musculoskeletal: No lower extremity tenderness nor edema.  Warm and well perfused Neurologic:  Normal speech and language. No gross focal neurologic deficits are appreciated.  Skin:  Skin is warm, dry and intact. No rash noted. Psychiatric: Mood and affect are normal. Speech and behavior are normal.  ____________________________________________   LABS (all labs ordered are listed, but only abnormal results are displayed)  Labs Reviewed  COMPREHENSIVE METABOLIC PANEL - Abnormal; Notable for the following components:      Result Value   Potassium 5.4 (*)    CO2 17 (*)    Glucose, Bld 127 (*)    BUN 23 (*)    Creatinine, Ser 1.31 (*)    AST 83 (*)    Alkaline Phosphatase 166 (*)    Total Bilirubin 1.8 (*)    GFR calc non Af Amer 52 (*)    Anion gap 17 (*)    All other components within normal limits  CBC - Abnormal; Notable for the following components:   RBC 3.76 (*)    HCT 39.4 (*)    MCV 104.8 (*)    MCH 34.7 (*)    RDW 16.6 (*)    Platelets 149 (*)    All other  components within normal limits  URINALYSIS, COMPLETE (UACMP) WITH MICROSCOPIC - Abnormal; Notable for the following components:   Color, Urine YELLOW (*)    APPearance CLEAR (*)    Hgb urine dipstick MODERATE (*)    Ketones, ur 20 (*)    Leukocytes, UA SMALL (*)    Squamous Epithelial / LPF 0-5 (*)    All other components within normal limits  BASIC METABOLIC PANEL - Abnormal; Notable for the following components:   CO2 19 (*)    Glucose, Bld 137 (*)    BUN 22 (*)    Calcium 8.5 (*)    All other components within normal limits  LIPASE, BLOOD   ____________________________________________  EKG  None ____________________________________________  RADIOLOGY  None ____________________________________________   PROCEDURES  Procedure(s) performed: No  Procedures   Critical Care performed:No ____________________________________________   INITIAL IMPRESSION / ASSESSMENT AND PLAN / ED COURSE  Pertinent labs & imaging results that were available during my care of the patient were reviewed by me and considered in my medical decision making (see chart for details).  Patient with nausea and vomiting and upper abdominal cramping, he is to be cystoscopy stomach spasms.  Differential diagnosis includes gastritis, spasms, diabetic gastroparesis, peptic ulcer disease.  Not consistent with bowel obstruction is no tenderness to palpation.  We will give IV fluids and IV Zofran and reevaluate  ----------------------------------------- 1:18 PM on 08/24/2017 -----------------------------------------  Patient feeling significantly better still with some abdominal pain, will give GI cocktail and small dose of morphine and reevaluate. will resend BMP as well   Patient much improved after treatment, repeat BMP is improving.  He is resting comfortably and asks to  be discharged so he can sleep.  I feel this is reasonable, he knows he can return anytime.  Will discharge with antiemetics,  PCP follow-up    ____________________________________________   FINAL CLINICAL IMPRESSION(S) / ED DIAGNOSES  Final diagnoses:  Acute gastritis without hemorrhage, unspecified gastritis type        Note:  This document was prepared using Dragon voice recognition software and may include unintentional dictation errors.    Lavonia Drafts, MD 08/24/17 [phone]

## 2017-08-24 NOTE — ED Notes (Signed)
Patient ambulatory to restroom with steady gait. Reports minimal improvement in nausea with no improvement in abdominal pain.

## 2017-08-24 NOTE — ED Notes (Signed)
Patient verbalized understanding of discharge instructions and follow-up care. To lobby in wheelchair with family.

## 2017-08-25 ENCOUNTER — Ambulatory Visit: Payer: Medicare Other | Admitting: Certified Registered Nurse Anesthetist

## 2017-08-25 ENCOUNTER — Encounter
Admission: RE | Disposition: A | Discharge status: Home / Self Care | Payer: Self-pay | Source: Ambulatory Visit | Attending: Internal Medicine

## 2017-08-25 ENCOUNTER — Ambulatory Visit
Admission: RE | Admit: 2017-08-25 | Discharge: 2017-08-25 | Disposition: A | Discharge status: Home / Self Care | Payer: Medicare Other | Source: Ambulatory Visit | Attending: Internal Medicine | Admitting: Internal Medicine

## 2017-08-25 DIAGNOSIS — I129 Hypertensive chronic kidney disease with stage 1 through stage 4 chronic kidney disease, or unspecified chronic kidney disease: Secondary | ICD-10-CM | POA: Insufficient documentation

## 2017-08-25 DIAGNOSIS — E785 Hyperlipidemia, unspecified: Secondary | ICD-10-CM | POA: Insufficient documentation

## 2017-08-25 DIAGNOSIS — D61818 Other pancytopenia: Secondary | ICD-10-CM | POA: Diagnosis not present

## 2017-08-25 DIAGNOSIS — I4891 Unspecified atrial fibrillation: Secondary | ICD-10-CM | POA: Diagnosis not present

## 2017-08-25 DIAGNOSIS — Z86010 Personal history of colonic polyps: Secondary | ICD-10-CM | POA: Diagnosis not present

## 2017-08-25 DIAGNOSIS — N189 Chronic kidney disease, unspecified: Secondary | ICD-10-CM | POA: Diagnosis not present

## 2017-08-25 DIAGNOSIS — I10 Essential (primary) hypertension: Secondary | ICD-10-CM | POA: Insufficient documentation

## 2017-08-25 DIAGNOSIS — G4730 Sleep apnea, unspecified: Secondary | ICD-10-CM | POA: Insufficient documentation

## 2017-08-25 DIAGNOSIS — K219 Gastro-esophageal reflux disease without esophagitis: Secondary | ICD-10-CM | POA: Diagnosis not present

## 2017-08-25 DIAGNOSIS — Z1211 Encounter for screening for malignant neoplasm of colon: Secondary | ICD-10-CM | POA: Diagnosis not present

## 2017-08-25 DIAGNOSIS — Z79899 Other long term (current) drug therapy: Secondary | ICD-10-CM | POA: Diagnosis not present

## 2017-08-25 DIAGNOSIS — K635 Polyp of colon: Secondary | ICD-10-CM | POA: Diagnosis not present

## 2017-08-25 DIAGNOSIS — N400 Enlarged prostate without lower urinary tract symptoms: Secondary | ICD-10-CM | POA: Insufficient documentation

## 2017-08-25 DIAGNOSIS — Z87891 Personal history of nicotine dependence: Secondary | ICD-10-CM | POA: Diagnosis not present

## 2017-08-25 DIAGNOSIS — K7460 Unspecified cirrhosis of liver: Secondary | ICD-10-CM | POA: Diagnosis not present

## 2017-08-25 DIAGNOSIS — K621 Rectal polyp: Secondary | ICD-10-CM | POA: Diagnosis not present

## 2017-08-25 DIAGNOSIS — E1122 Type 2 diabetes mellitus with diabetic chronic kidney disease: Secondary | ICD-10-CM | POA: Insufficient documentation

## 2017-08-25 DIAGNOSIS — Z8673 Personal history of transient ischemic attack (TIA), and cerebral infarction without residual deficits: Secondary | ICD-10-CM | POA: Diagnosis not present

## 2017-08-25 DIAGNOSIS — K2950 Unspecified chronic gastritis without bleeding: Secondary | ICD-10-CM | POA: Insufficient documentation

## 2017-08-25 DIAGNOSIS — Z7902 Long term (current) use of antithrombotics/antiplatelets: Secondary | ICD-10-CM | POA: Diagnosis not present

## 2017-08-25 DIAGNOSIS — Z87442 Personal history of urinary calculi: Secondary | ICD-10-CM | POA: Insufficient documentation

## 2017-08-25 DIAGNOSIS — K5100 Ulcerative (chronic) pancolitis without complications: Secondary | ICD-10-CM | POA: Diagnosis not present

## 2017-08-25 DIAGNOSIS — Z794 Long term (current) use of insulin: Secondary | ICD-10-CM | POA: Insufficient documentation

## 2017-08-25 HISTORY — DX: Chronic kidney disease, unspecified: N18.9

## 2017-08-25 HISTORY — DX: Achalasia of cardia: K22.0

## 2017-08-25 HISTORY — PX: COLONOSCOPY WITH PROPOFOL: SHX5780

## 2017-08-25 HISTORY — PX: ESOPHAGOGASTRODUODENOSCOPY (EGD) WITH PROPOFOL: SHX5813

## 2017-08-25 HISTORY — DX: Cardiac arrhythmia, unspecified: I49.9

## 2017-08-25 HISTORY — DX: Benign prostatic hyperplasia without lower urinary tract symptoms: N40.0

## 2017-08-25 HISTORY — DX: Sleep apnea, unspecified: G47.30

## 2017-08-25 HISTORY — DX: Unspecified cirrhosis of liver: K74.60

## 2017-08-25 HISTORY — DX: Esophageal varices without bleeding: I85.00

## 2017-08-25 HISTORY — DX: Ulcerative colitis, unspecified, without complications: K51.90

## 2017-08-25 SURGERY — ESOPHAGOGASTRODUODENOSCOPY (EGD) WITH PROPOFOL
Anesthesia: General

## 2017-08-25 MED ORDER — PROPOFOL 10 MG/ML IV BOLUS
INTRAVENOUS | Status: AC
  Filled 2017-08-25: qty 20

## 2017-08-25 MED ORDER — PROPOFOL 500 MG/50ML IV EMUL
INTRAVENOUS | Status: AC
  Filled 2017-08-25: qty 50

## 2017-08-25 MED ORDER — PROPOFOL 10 MG/ML IV BOLUS
INTRAVENOUS | Status: DC | PRN
  Administered 2017-08-25 (×3): 30 mg via INTRAVENOUS
  Administered 2017-08-25: 40 mg via INTRAVENOUS
  Administered 2017-08-25: 10 mg via INTRAVENOUS

## 2017-08-25 MED ORDER — LIDOCAINE HCL (PF) 2 % IJ SOLN
INTRAMUSCULAR | Status: AC
  Filled 2017-08-25: qty 10

## 2017-08-25 MED ORDER — LABETALOL HCL 5 MG/ML IV SOLN
INTRAVENOUS | Status: DC | PRN
  Administered 2017-08-25: 10 mg via INTRAVENOUS

## 2017-08-25 MED ORDER — SODIUM CHLORIDE 0.9 % IV SOLN
INTRAVENOUS | Status: DC
  Administered 2017-08-25: 09:00:00 via INTRAVENOUS

## 2017-08-25 MED ORDER — LIDOCAINE HCL (CARDIAC) 20 MG/ML IV SOLN
INTRAVENOUS | Status: DC | PRN
  Administered 2017-08-25: 50 mg via INTRAVENOUS

## 2017-08-25 MED ORDER — PROPOFOL 500 MG/50ML IV EMUL
INTRAVENOUS | Status: DC | PRN
  Administered 2017-08-25: 160 ug/kg/min via INTRAVENOUS

## 2017-08-25 MED ORDER — LABETALOL HCL 5 MG/ML IV SOLN
INTRAVENOUS | Status: AC
  Filled 2017-08-25: qty 4

## 2017-08-25 NOTE — Interval H&P Note (Signed)
History and Physical Interval Note:  08/25/2017 9:40 AM  Bernard Pennington  has presented today for surgery, with the diagnosis of GERD,ESOPHAGEAL VARICES ULCERATIVE COLITIS  The various methods of treatment have been discussed with the patient and family. After consideration of risks, benefits and other options for treatment, the patient has consented to  Procedure(s): ESOPHAGOGASTRODUODENOSCOPY (EGD) WITH PROPOFOL (N/A) COLONOSCOPY WITH PROPOFOL (N/A) as a surgical intervention .  The patient's history has been reviewed, patient examined, no change in status, stable for surgery.  I have reviewed the patient's chart and labs.  Questions were answered to the patient's satisfaction.     [place], [place]

## 2017-08-25 NOTE — Anesthesia Procedure Notes (Signed)
Date/Time: 08/25/2017 9:37 AM Performed by: Johnna Acosta, CRNA Pre-anesthesia Checklist: Patient identified, Emergency Drugs available, Suction available, Patient being monitored and Timeout performed Patient Re-evaluated:Patient Re-evaluated prior to induction Oxygen Delivery Method: Nasal cannula

## 2017-08-25 NOTE — Anesthesia Post-op Follow-up Note (Signed)
Anesthesia QCDR form completed.        

## 2017-08-25 NOTE — H&P (Signed)
Outpatient short stay form Pre-procedure 08/25/2017 9:38 AM Tracina Beaumont K. Alice Reichert, M.D.  Primary Physician: Johny Drilling, M.D.  Reason for visit:  Presumptive cirrhosis, hx esophageal varices, personal hx of colon polyps, Chronic ulcerative pancolitis in remission.  History of present illness:  As above. Patient presents for colonoscopy for screening/surveillance. The patient denies complaints of abdominal pain, significant change in bowel habits, or rectal bleeding.     Current Facility-Administered Medications:  .  0.9 %  sodium chloride infusion, , Intravenous, Continuous, [place], Benay Pike, MD, Last Rate: 20 mL/hr at 08/25/17 0912  Medications Prior to Admission  Medication Sig Dispense Refill Last Dose  . cloNIDine (CATAPRES) 0.1 MG tablet Take 0.1 mg by mouth 2 (two) times daily.    08/24/2017 at Unknown time  . insulin aspart (NOVOLOG) 100 UNIT/ML injection Inject 4 Units into the skin 3 (three) times daily with meals. 10 mL 11 08/24/2017 at Unknown time  . lisinopril (PRINIVIL,ZESTRIL) 20 MG tablet Take 20 mg by mouth daily.   08/24/2017 at Unknown time  . metoCLOPramide (REGLAN) 5 MG tablet Take 1 tablet (5 mg total) by mouth every 8 (eight) hours as needed for nausea. 20 tablet 0 08/24/2017 at Unknown time  . omeprazole (PRILOSEC) 20 MG capsule Take 40 mg by mouth daily.   08/24/2017 at Unknown time  . ondansetron (ZOFRAN ODT) 4 MG disintegrating tablet Take 1 tablet (4 mg total) by mouth every 8 (eight) hours as needed for nausea or vomiting. 10 tablet 0 08/24/2017 at Unknown time  . apixaban (ELIQUIS) 5 MG TABS tablet Take 5 mg by mouth every 12 (twelve) hours.   08/22/2017  . Ascorbic Acid (VITAMIN C) 1000 MG tablet Take 1,000 mg by mouth 3 (three) times daily.   08/21/2017  . blood glucose meter kit and supplies KIT Dispense based on patient and insurance preference. Use up to four times daily as directed. (FOR ICD-9 250.00, 250.01). 1 each 0 Taking  . CINNAMON PO Take 1 tablet by  mouth 3 (three) times daily before meals.   08/21/2017  . gabapentin (NEURONTIN) 300 MG capsule Take 900 mg by mouth 3 (three) times daily.   Past Week at 0800  . insulin aspart (NOVOLOG) 100 UNIT/ML injection TID w/ meals.    BS < 120 - 0 units BS 121-150 - 1 unit SQ BS 151-200 - 2 units SQ BS 201-250 - 3 units SQ BS 251-300 - 4 units SQ BS 301-350 - 5 units SQ BS 351-400 - 6 units SQ   > 400 Call PCP. 10 mL 11 prn at prn  . insulin NPH Human (HUMULIN N,NOVOLIN N) 100 UNIT/ML injection Inject 16 Units into the skin 2 (two) times daily.   Past Week at 1800  . magnesium oxide (MAG-OX) 400 MG tablet Take 400 mg by mouth 2 (two) times daily.   08/21/2017  . metFORMIN (GLUCOPHAGE) 500 MG tablet Take 500 mg by mouth 2 (two) times daily with a meal.   08/23/2017  . Multiple Vitamin (MULTIVITAMIN WITH MINERALS) TABS tablet Take 1 tablet by mouth daily.   Past Week at 0800  . niacin 500 MG tablet Take 1,000 mg by mouth 2 (two) times daily.   Past Week at 1800  . Omega-3 Fatty Acids (FISH OIL) 1000 MG CAPS Take 1,000 mg by mouth daily.   08/21/2017  . ranitidine (ZANTAC) 150 MG tablet Take 150 mg by mouth 2 (two) times daily as needed for heartburn.   08/21/2017  . sotalol (BETAPACE)  80 MG tablet Take 1 tablet (80 mg total) by mouth daily. (Patient not taking: Reported on 08/25/2017)   Not Taking at Unknown time  . sulfaSALAzine (AZULFIDINE) 500 MG tablet Take 1,000 mg by mouth 2 (two) times daily.   08/23/2017  . tamsulosin (FLOMAX) 0.4 MG CAPS capsule Take 1 capsule by mouth daily.   08/21/2017  . VITAMIN E PO Take 1 capsule by mouth 2 (two) times daily.   08/21/2017     No Active Allergies   Past Medical History:  Diagnosis Date  . A-fib ([place])   . Achalasia   . BPH (benign prostatic hyperplasia)   . Chronic kidney disease    kidney stones  . Cirrhosis ([place])   . Constipation   . Diabetes mellitus without complication ([place])   . Dysrhythmia    a-fib  . Esophageal varices ([place])   .  GERD (gastroesophageal reflux disease)   . Headache   . Hyperlipemia   . Hypertension   . Pancytopenia ([place])   . Sleep apnea   . TIA (transient ischemic attack)   . Ulcerative colitis ([place])     Review of systems:      Physical Exam  Gen: NAD. Appears comfortable.  HEENT: [place]/AT. PERRLA. Normal external ear exam.  Chest: CTA, no wheezes.  CV: RR nl S1, S2. No gallops.  Abd: soft, nt, nd. BS+  Ext: no edema. Pulses 2+  Neuro: Alert and oriented. Judgement appears normal. Nonfocal.     Planned procedures: EGD and colonoscopy. The patient understands the nature of the planned procedure, indications, risks, alternatives and potential complications including but not limited to bleeding, infection, perforation, damage to internal organs and possible oversedation/side effects from anesthesia. The patient agrees and gives consent to proceed.  Please refer to procedure notes for findings, recommendations and patient disposition/instructions.    Noelie Renfrow K. Alice Reichert, M.D. Gastroenterology 08/25/2017  9:38 AM

## 2017-08-25 NOTE — Transfer of Care (Signed)
Immediate Anesthesia Transfer of Care Note  Patient: Bernard Pennington  Procedure(s) Performed: ESOPHAGOGASTRODUODENOSCOPY (EGD) WITH PROPOFOL (N/A ) COLONOSCOPY WITH PROPOFOL (N/A )  Patient Location: PACU  Anesthesia Type:General  Level of Consciousness: sedated  Airway & Oxygen Therapy: Patient Spontanous Breathing and Patient connected to nasal cannula oxygen  Post-op Assessment: Report given to RN and Post -op Vital signs reviewed and stable  Post vital signs: Reviewed and stable  Last Vitals:  Vitals:   08/25/17 0900 08/25/17 1030  BP: (!) 151/87 130/65  Pulse: 81 70  Resp: 20 20  Temp: (!) 36 C (!) 36.3 C  SpO2: 100% 100%    Last Pain:  Vitals:   08/25/17 1030  PainSc: Asleep         Complications: No apparent anesthesia complications

## 2017-08-25 NOTE — Anesthesia Postprocedure Evaluation (Signed)
Anesthesia Post Note  Patient: Ryen Rhames  Procedure(s) Performed: ESOPHAGOGASTRODUODENOSCOPY (EGD) WITH PROPOFOL (N/A ) COLONOSCOPY WITH PROPOFOL (N/A )  Patient location during evaluation: Endoscopy Anesthesia Type: General Level of consciousness: awake and alert Pain management: pain level controlled Vital Signs Assessment: post-procedure vital signs reviewed and stable Respiratory status: spontaneous breathing, nonlabored ventilation, respiratory function stable and patient connected to nasal cannula oxygen Cardiovascular status: blood pressure returned to baseline and stable Postop Assessment: no apparent nausea or vomiting Anesthetic complications: no     Last Vitals:  Vitals:   08/25/17 1100 08/25/17 1110  BP: (!) 186/81   Pulse: 65 65  Resp: 19 (!) 21  Temp:    SpO2: 100% 99%    Last Pain:  Vitals:   08/25/17 1110  PainSc: 0-No pain                 Martha Clan

## 2017-08-25 NOTE — Op Note (Signed)
[facility] Gastroenterology Patient Name: Bernard Pennington Procedure Date: 08/25/2017 9:26 AM MRN: [id number] Account #: [id number] Date of Birth: [birth year] Admit Type: Outpatient Age: 71 Room: [facility] ENDO ROOM 4 Gender: Male Note Status: Finalized Procedure:            Colonoscopy Indications:          High risk colon cancer surveillance: Personal history                        of colonic polyps, High risk colon cancer surveillance:                        Ulcerative pancolitis of 8 (or more) years duration Providers:            Kiron Osmun K. Alice Reichert MD, MD Referring MD:         Wynona Canes. Kym Groom, MD (Referring MD) Medicines:            Propofol per Anesthesia Complications:        No immediate complications. Procedure:            Pre-Anesthesia Assessment:                       - The risks and benefits of the procedure and the                        sedation options and risks were discussed with the                        patient. All questions were answered and informed                        consent was obtained.                       - Patient identification and proposed procedure were                        verified prior to the procedure by the nurse. The                        procedure was verified in the procedure room.                       - ASA Grade Assessment: III - A patient with severe                        systemic disease.                       - After reviewing the risks and benefits, the patient                        was deemed in satisfactory condition to undergo the                        procedure.                       After obtaining informed consent, the colonoscope was  passed under direct vision. Throughout the procedure,                        the patient's blood pressure, pulse, and oxygen                        saturations were monitored continuously. The                        Colonoscope was introduced through  the anus and                        advanced to the the cecum, identified by appendiceal                        orifice and ileocecal valve. The colonoscopy was                        technically difficult and complex due to significant                        looping. Successful completion of the procedure was                        aided by applying abdominal pressure. The patient                        tolerated the procedure well. The quality of the bowel                        preparation was adequate to identify polyps. The                        ileocecal valve, appendiceal orifice, and rectum were                        photographed. Findings:      The perianal and digital rectal examinations were normal.      A 3 mm polyp was found in the sigmoid colon. The polyp was sessile. The       polyp was removed with a jumbo cold forceps. Resection and retrieval       were complete.      Two sessile polyps were found in the rectum. The polyps were 2 to 3 mm       in size. These polyps were removed with a jumbo cold forceps. Resection       and retrieval were complete.      Normal mucosa was found in the entire colon. Two biopsies were taken       every 10 cm with a cold forceps from the right colon, transverse colon,       left colon and rectum for dysplasia surveillance. These biopsy specimens       from the right colon, transverse colon, left colon and rectum were sent       to Pathology.      The exam was otherwise without abnormality on direct and retroflexion       views. Impression:           - One 3 mm polyp in the sigmoid colon, removed with a  jumbo cold forceps. Resected and retrieved.                       - Two 2 to 3 mm polyps in the rectum, removed with a                        jumbo cold forceps. Resected and retrieved.                       - Normal mucosa in the entire examined colon. Biopsied.                       - The examination was otherwise  normal on direct and                        retroflexion views. Recommendation:       - Patient has a contact number available for                        emergencies. The signs and symptoms of potential                        delayed complications were discussed with the patient.                        Return to normal activities tomorrow. Written discharge                        instructions were provided to the patient.                       - Resume previous diet.                       - Continue present medications.                       - Resume Eliquis (apixaban) at prior dose tomorrow.                        Refer to managing physician for further adjustment of                        therapy.                       - Repeat colonoscopy is recommended for surveillance.                        The colonoscopy date will be determined after pathology                        results from today's exam become available for review.                       - Return to GI office in 3 months.                       - The findings and recommendations were discussed with  the patient and their family. Procedure Code(s):    --- Professional ---                       [phone], Colonoscopy, flexible; with biopsy, single or                        multiple Diagnosis Code(s):    --- Professional ---                       Z86.010, Personal history of colonic polyps                       K51.00, Ulcerative (chronic) pancolitis without                        complications                       D12.5, Benign neoplasm of sigmoid colon                       K62.1, Rectal polyp CPT copyright 2016 American Medical Association. All rights reserved. The codes documented in this report are preliminary and upon coder review may  be revised to meet current compliance requirements. Efrain Sella MD, MD 08/25/2017 10:32:28 AM This report has been signed electronically. Number of Addenda:  0 Note Initiated On: 08/25/2017 9:26 AM Scope Withdrawal Time: 0 hours 18 minutes 4 seconds  Total Procedure Duration: 0 hours 32 minutes 36 seconds       [facility]

## 2017-08-25 NOTE — Progress Notes (Signed)
Dr. Rosey Bath at the bedside and aware of pt increased BP. Pt encouraged by Dr. Rosey Bath to take BP medications and he was free to go home. Pt is alert, calm and has no s/s of distress. VS and orders assessed and will contimue to monitor and tx pt according to MD orders.

## 2017-08-25 NOTE — Anesthesia Preprocedure Evaluation (Signed)
Anesthesia Evaluation  Patient identified by MRN, date of birth, ID band Patient awake    Reviewed: Allergy & Precautions, H&P , NPO status , Patient's Chart, lab work & pertinent test results, reviewed documented beta blocker date and time   History of Anesthesia Complications Negative for: history of anesthetic complications  Airway Mallampati: III  TM Distance: >3 FB Neck ROM: full    Dental  (+) Caps, Dental Advidsory Given, Missing   Pulmonary neg shortness of breath, sleep apnea and Continuous Positive Airway Pressure Ventilation , neg COPD, neg recent URI, former smoker,           Cardiovascular Exercise Tolerance: Good hypertension, (-) angina(-) CAD, (-) Past MI, (-) Cardiac Stents and (-) CABG + dysrhythmias Atrial Fibrillation (-) Valvular Problems/Murmurs     Neuro/Psych neg Seizures TIAnegative psych ROS   GI/Hepatic PUD, GERD  ,(+) Cirrhosis       ,   Endo/Other  diabetes, Insulin Dependent  Renal/GU CRFRenal disease (kidney stones)  negative genitourinary   Musculoskeletal   Abdominal   Peds  Hematology negative hematology ROS (+)   Anesthesia Other Findings Past Medical History: No date: A-fib ([facility]) No date: Achalasia No date: BPH (benign prostatic hyperplasia) No date: Chronic kidney disease     Comment:  kidney stones No date: Cirrhosis ([place]) No date: Constipation No date: Diabetes mellitus without complication ([facility]) No date: Dysrhythmia     Comment:  a-fib No date: Esophageal varices ([facility]) No date: GERD (gastroesophageal reflux disease) No date: Headache No date: Hyperlipemia No date: Hypertension No date: Pancytopenia ([facility]) No date: Sleep apnea No date: TIA (transient ischemic attack) No date: Ulcerative colitis ([place])   Reproductive/Obstetrics negative OB ROS                             Anesthesia Physical Anesthesia Plan  ASA: III  Anesthesia Plan:  General   Post-op Pain Management:    Induction: Intravenous  PONV Risk Score and Plan: 2 and Propofol infusion  Airway Management Planned: Nasal Cannula  Additional Equipment:   Intra-op Plan:   Post-operative Plan:   Informed Consent: I have reviewed the patients History and Physical, chart, labs and discussed the procedure including the risks, benefits and alternatives for the proposed anesthesia with the patient or authorized representative who has indicated his/her understanding and acceptance.   Dental Advisory Given  Plan Discussed with: Anesthesiologist, CRNA and Surgeon  Anesthesia Plan Comments:         Anesthesia Quick Evaluation

## 2017-08-25 NOTE — Op Note (Signed)
[facility] Gastroenterology Patient Name: Bernard Pennington Procedure Date: 08/25/2017 9:26 AM MRN: [id number] Account #: [id number] Date of Birth: 1946-08-29 Admit Type: Outpatient Age: 71 Room: [facility] ENDO ROOM 4 Gender: Male Note Status: Finalized Procedure:            Upper GI endoscopy Indications:          To evaluate esophageal varices in patient with                        suspected portal hypertension Providers:            Benay Pike. Alice Reichert MD, MD Referring MD:         Wynona Canes. Kym Groom, MD (Referring MD) Medicines:            Propofol per Anesthesia Complications:        No immediate complications. Procedure:            Pre-Anesthesia Assessment:                       - The risks and benefits of the procedure and the                        sedation options and risks were discussed with the                        patient. All questions were answered and informed                        consent was obtained.                       - Patient identification and proposed procedure were                        verified prior to the procedure by the nurse. The                        procedure was verified in the procedure room.                       - ASA Grade Assessment: III - A patient with severe                        systemic disease.                       - After reviewing the risks and benefits, the patient                        was deemed in satisfactory condition to undergo the                        procedure.                       After obtaining informed consent, the endoscope was                        passed under direct vision. Throughout the procedure,  the patient's blood pressure, pulse, and oxygen                        saturations were monitored continuously. The Endoscope                        was introduced through the mouth, and advanced to the                        third part of duodenum. The upper GI endoscopy was                         accomplished without difficulty. The patient tolerated                        the procedure well. Findings:      The examined esophagus was normal.      Diffuse mildly erythematous mucosa without bleeding was found in the       entire examined stomach.      Localized mild inflammation characterized by erosions was found in the       gastric antrum.      The cardia and gastric fundus were normal on retroflexion.      The examined duodenum was normal.      The exam was otherwise without abnormality. Impression:           - Normal esophagus.                       - Erythematous mucosa in the stomach.                       - Chronic gastritis.                       - No specimens collected. Recommendation:       - Repeat upper endoscopy in 1 year for surveillance.                       - See the other procedure note for documentation of                        additional recommendations. Procedure Code(s):    --- Professional ---                       [phone], Esophagogastroduodenoscopy, flexible, transoral;                        diagnostic, including collection of specimen(s) by                        brushing or washing, when performed (separate procedure) Diagnosis Code(s):    --- Professional ---                       K31.89, Other diseases of stomach and duodenum                       K29.50, Unspecified chronic gastritis without bleeding                       I85.00, Esophageal varices without bleeding CPT  copyright 2016 American Medical Association. All rights reserved. The codes documented in this report are preliminary and upon coder review may  be revised to meet current compliance requirements. Efrain Sella MD, MD 08/25/2017 9:51:18 AM This report has been signed electronically. Number of Addenda: 0 Note Initiated On: 08/25/2017 9:26 AM      [facility]

## 2017-08-26 ENCOUNTER — Encounter: Payer: Self-pay | Admitting: Internal Medicine

## 2017-08-27 LAB — SURGICAL PATHOLOGY

## 2017-09-02 ENCOUNTER — Other Ambulatory Visit: Payer: Self-pay | Admitting: Internal Medicine

## 2017-09-02 DIAGNOSIS — D61818 Other pancytopenia: Secondary | ICD-10-CM

## 2017-09-08 ENCOUNTER — Ambulatory Visit
Admission: RE | Admit: 2017-09-08 | Discharge: 2017-09-08 | Disposition: A | Discharge status: Home / Self Care | Payer: Medicare Other | Source: Ambulatory Visit | Attending: Internal Medicine | Admitting: Internal Medicine

## 2017-09-08 DIAGNOSIS — D1803 Hemangioma of intra-abdominal structures: Secondary | ICD-10-CM | POA: Insufficient documentation

## 2017-09-08 DIAGNOSIS — D61818 Other pancytopenia: Secondary | ICD-10-CM | POA: Diagnosis present

## 2017-09-08 DIAGNOSIS — N200 Calculus of kidney: Secondary | ICD-10-CM | POA: Diagnosis not present

## 2017-09-08 DIAGNOSIS — E119 Type 2 diabetes mellitus without complications: Secondary | ICD-10-CM | POA: Insufficient documentation

## 2017-09-08 DIAGNOSIS — Z9049 Acquired absence of other specified parts of digestive tract: Secondary | ICD-10-CM | POA: Insufficient documentation

## 2017-09-08 DIAGNOSIS — Z794 Long term (current) use of insulin: Secondary | ICD-10-CM | POA: Insufficient documentation

## 2017-12-17 ENCOUNTER — Emergency Department: Payer: Medicare Other

## 2017-12-17 ENCOUNTER — Emergency Department
Admission: EM | Admit: 2017-12-17 | Discharge: 2017-12-17 | Disposition: A | Discharge status: Home / Self Care | Payer: Medicare Other | Attending: Student in an Organized Health Care Education/Training Program | Admitting: Student in an Organized Health Care Education/Training Program

## 2017-12-17 ENCOUNTER — Encounter: Payer: Self-pay | Admitting: Emergency Medicine

## 2017-12-17 DIAGNOSIS — Z87891 Personal history of nicotine dependence: Secondary | ICD-10-CM | POA: Insufficient documentation

## 2017-12-17 DIAGNOSIS — Z96652 Presence of left artificial knee joint: Secondary | ICD-10-CM | POA: Insufficient documentation

## 2017-12-17 DIAGNOSIS — I129 Hypertensive chronic kidney disease with stage 1 through stage 4 chronic kidney disease, or unspecified chronic kidney disease: Secondary | ICD-10-CM | POA: Insufficient documentation

## 2017-12-17 DIAGNOSIS — Z794 Long term (current) use of insulin: Secondary | ICD-10-CM | POA: Diagnosis not present

## 2017-12-17 DIAGNOSIS — N189 Chronic kidney disease, unspecified: Secondary | ICD-10-CM | POA: Insufficient documentation

## 2017-12-17 DIAGNOSIS — T68XXXA Hypothermia, initial encounter: Secondary | ICD-10-CM

## 2017-12-17 DIAGNOSIS — R680 Hypothermia, not associated with low environmental temperature: Secondary | ICD-10-CM | POA: Insufficient documentation

## 2017-12-17 DIAGNOSIS — E162 Hypoglycemia, unspecified: Secondary | ICD-10-CM

## 2017-12-17 DIAGNOSIS — E1122 Type 2 diabetes mellitus with diabetic chronic kidney disease: Secondary | ICD-10-CM | POA: Diagnosis not present

## 2017-12-17 DIAGNOSIS — R4182 Altered mental status, unspecified: Secondary | ICD-10-CM | POA: Diagnosis not present

## 2017-12-17 DIAGNOSIS — E11649 Type 2 diabetes mellitus with hypoglycemia without coma: Secondary | ICD-10-CM | POA: Insufficient documentation

## 2017-12-17 LAB — CBC WITH DIFFERENTIAL/PLATELET
BASOS ABS: 0 10*3/uL (ref 0–0.1)
BASOS PCT: 1 %
EOS ABS: 0.1 10*3/uL (ref 0–0.7)
EOS PCT: 2 %
HCT: 29.8 % — ABNORMAL LOW (ref 40.0–52.0)
Hemoglobin: 9.2 g/dL — ABNORMAL LOW (ref 13.0–18.0)
Lymphocytes Relative: 22 %
Lymphs Abs: 0.8 10*3/uL — ABNORMAL LOW (ref 1.0–3.6)
MCH: 29.2 pg (ref 26.0–34.0)
MCHC: 30.9 g/dL — AB (ref 32.0–36.0)
MCV: 94.5 fL (ref 80.0–100.0)
MONOS PCT: 16 %
Monocytes Absolute: 0.6 10*3/uL (ref 0.2–1.0)
NEUTROS ABS: 2.3 10*3/uL (ref 1.4–6.5)
Neutrophils Relative %: 59 %
PLATELETS: 70 10*3/uL — AB (ref 150–440)
RBC: 3.15 MIL/uL — ABNORMAL LOW (ref 4.40–5.90)
RDW: 16.9 % — ABNORMAL HIGH (ref 11.5–14.5)
WBC: 3.8 10*3/uL (ref 3.8–10.6)

## 2017-12-17 LAB — URINALYSIS, COMPLETE (UACMP) WITH MICROSCOPIC
BACTERIA UA: NONE SEEN
BILIRUBIN URINE: NEGATIVE
GLUCOSE, UA: 50 mg/dL — AB
HGB URINE DIPSTICK: NEGATIVE
KETONES UR: NEGATIVE mg/dL
NITRITE: NEGATIVE
PROTEIN: NEGATIVE mg/dL
Specific Gravity, Urine: 1.017 (ref 1.005–1.030)
pH: 5 (ref 5.0–8.0)

## 2017-12-17 LAB — COMPREHENSIVE METABOLIC PANEL
ALBUMIN: 3 g/dL — AB (ref 3.5–5.0)
ALT: 20 U/L (ref 17–63)
ANION GAP: 5 (ref 5–15)
AST: 32 U/L (ref 15–41)
Alkaline Phosphatase: 132 U/L — ABNORMAL HIGH (ref 38–126)
BUN: 21 mg/dL — AB (ref 6–20)
CHLORIDE: 108 mmol/L (ref 101–111)
CO2: 25 mmol/L (ref 22–32)
Calcium: 8.1 mg/dL — ABNORMAL LOW (ref 8.9–10.3)
Creatinine, Ser: 0.99 mg/dL (ref 0.61–1.24)
GFR calc Af Amer: >60 mL/min (ref >60)
GFR calc non Af Amer: >60 mL/min (ref >60)
GLUCOSE: 121 mg/dL — AB (ref 65–99)
POTASSIUM: 4.4 mmol/L (ref 3.5–5.1)
SODIUM: 138 mmol/L (ref 135–145)
Total Bilirubin: 1.1 mg/dL (ref 0.3–1.2)
Total Protein: 5.9 g/dL — ABNORMAL LOW (ref 6.5–8.1)

## 2017-12-17 LAB — URINE DRUG SCREEN, QUALITATIVE (ARMC ONLY)
AMPHETAMINES, UR SCREEN: NOT DETECTED
BENZODIAZEPINE, UR SCRN: NOT DETECTED
Barbiturates, Ur Screen: NOT DETECTED
COCAINE METABOLITE, UR ~~LOC~~: NOT DETECTED
Cannabinoid 50 Ng, Ur ~~LOC~~: NOT DETECTED
MDMA (ECSTASY) UR SCREEN: NOT DETECTED
METHADONE SCREEN, URINE: NOT DETECTED
OPIATE, UR SCREEN: NOT DETECTED
PHENCYCLIDINE (PCP) UR S: NOT DETECTED
Tricyclic, Ur Screen: NOT DETECTED

## 2017-12-17 LAB — GLUCOSE, CAPILLARY
GLUCOSE-CAPILLARY: 110 mg/dL — AB (ref 65–99)
GLUCOSE-CAPILLARY: 94 mg/dL (ref 65–99)
Glucose-Capillary: 87 mg/dL (ref 65–99)
Glucose-Capillary: 105 mg/dL — ABNORMAL HIGH (ref 65–99)

## 2017-12-17 LAB — INFLUENZA PANEL BY PCR (TYPE A & B)
Influenza A By PCR: NEGATIVE
Influenza B By PCR: NEGATIVE

## 2017-12-17 LAB — TROPONIN I: Troponin I: <0.03 ng/mL (ref <0.03)

## 2017-12-17 LAB — LACTIC ACID, PLASMA: Lactic Acid, Venous: 1.4 mmol/L (ref 0.5–1.9)

## 2017-12-17 LAB — PROCALCITONIN

## 2017-12-17 MED ORDER — SULFASALAZINE 500 MG PO TABS: 1000.0000 mg | ORAL_TABLET | Freq: Two times a day (BID) | ORAL | 0 refills | Status: DC

## 2017-12-17 MED ORDER — METFORMIN HCL 500 MG PO TABS: 500.0000 mg | ORAL_TABLET | Freq: Two times a day (BID) | ORAL | 0 refills | Status: DC

## 2017-12-17 MED ORDER — ACETAMINOPHEN 500 MG PO TABS
1000.0000 mg | ORAL_TABLET | Freq: Once | ORAL | Status: DC
  Filled 2017-12-17: qty 2

## 2017-12-17 NOTE — ED Notes (Signed)
PT able to ambulate up and down hallway without trouble or dizziness. Pt in NAD. MD made aware.

## 2017-12-17 NOTE — ED Triage Notes (Signed)
Pt arrived via ems after being found altered by family. Family reports patient's normal blood sugar runs around 200-300. Family found a blood sugar of 60 and ems administered 1 amp of D50 with an increase in blood sugar to 260. Upon arrival to ED pt alert and oriented x 4 but presents as very sleepy. Pt's blood sugar 110.

## 2017-12-17 NOTE — ED Notes (Signed)
PT eating at this time and then RN will ambulate pt around room.

## 2017-12-17 NOTE — ED Notes (Signed)
Pt's son in law at bedside.

## 2017-12-17 NOTE — ED Provider Notes (Signed)
[facility] Emergency Department Provider Note    None    (approximate)  I have reviewed the triage vital signs and the nursing notes.   HISTORY  Chief Complaint Hypoglycemia    HPI Bernard Pennington is a 72 y.o. male with multiple chronic comorbidities presents to the ER for altered mental status found this morning very confused.  Found to have blood sugar of 60 and EMS gave 1 amp of D50 with increase of his blood sugar.  There was initially concern about stroke as he was altered but did not have any lateralizing weakness, facial droop noted.  Last seen normal was the night prior around 8 PM.  Patient denies any blood in his stool.  Denies any melena.  Denies any abdominal pain.  He is on Eliquis.  Past Medical History:  Diagnosis Date  . A-fib ([place])   . Achalasia   . BPH (benign prostatic hyperplasia)   . Chronic kidney disease    kidney stones  . Cirrhosis ([place])   . Constipation   . Diabetes mellitus without complication ([place])   . Dysrhythmia    a-fib  . Esophageal varices ([place])   . GERD (gastroesophageal reflux disease)   . Headache   . Hyperlipemia   . Hypertension   . Pancytopenia ([place])   . Sleep apnea   . TIA (transient ischemic attack)   . Ulcerative colitis ([place])    Family History  Problem Relation Age of Onset  . Heart attack Mother   . Stroke Father   . Cancer Father   . Diabetes Brother    Past Surgical History:  Procedure Laterality Date  . CHOLECYSTECTOMY    . COLONOSCOPY WITH PROPOFOL N/A 08/25/2017   Procedure: COLONOSCOPY WITH PROPOFOL;  Surgeon: Toledo, Benay Pike, MD;  Location: [facility] ENDOSCOPY;  Service: Gastroenterology;  Laterality: N/A;  . ESOPHAGOGASTRODUODENOSCOPY (EGD) WITH PROPOFOL N/A 08/25/2017   Procedure: ESOPHAGOGASTRODUODENOSCOPY (EGD) WITH PROPOFOL;  Surgeon: Toledo, Benay Pike, MD;  Location: [facility] ENDOSCOPY;  Service: Gastroenterology;  Laterality: N/A;  . JOINT REPLACEMENT     L partial knee  . KIDNEY  SURGERY    . LITHOTRIPSY    . VASECTOMY     Patient Active Problem List   Diagnosis Date Noted  . Hypoglycemia 06/22/2017  . TIA (transient ischemic attack) 08/19/2015      Prior to Admission medications   Medication Sig Start Date End Date Taking? Authorizing Provider  apixaban (ELIQUIS) 5 MG TABS tablet Take 5 mg by mouth every 12 (twelve) hours.   Yes [provider]  Ascorbic Acid (VITAMIN C) 1000 MG tablet Take 1,000 mg by mouth 3 (three) times daily.   Yes [provider]  CINNAMON PO Take 1 tablet by mouth 3 (three) times daily before meals.   Yes [provider]  cloNIDine (CATAPRES) 0.1 MG tablet Take 0.1 mg by mouth 2 (two) times daily.    Yes [provider]  gabapentin (NEURONTIN) 300 MG capsule Take 900 mg by mouth 3 (three) times daily.   Yes [provider]  insulin NPH Human (HUMULIN N,NOVOLIN N) 100 UNIT/ML injection Inject 18 Units into the skin 2 (two) times daily.    Yes [provider]  insulin regular (NOVOLIN R,HUMULIN R) 100 units/mL injection Inject 0-36 Units into the skin 3 (three) times daily before meals.   Yes [provider]  magnesium oxide (MAG-OX) 400 MG tablet Take 400 mg by mouth 2 (two) times daily.   Yes  [provider]  mirtazapine (REMERON) 15 MG tablet Take 1 tablet by mouth at bedtime. 10/13/17  Yes [provider]  Multiple Vitamin (MULTIVITAMIN WITH MINERALS) TABS tablet Take 1 tablet by mouth daily.   Yes [provider]  niacin 500 MG tablet Take 1,000 mg by mouth 2 (two) times daily.   Yes [provider]  Omega-3 Fatty Acids (FISH OIL) 1000 MG CAPS Take 1,000 mg by mouth daily.   Yes [provider]  omeprazole (PRILOSEC) 20 MG capsule Take 40 mg by mouth daily.   Yes [provider]  ranitidine (ZANTAC) 150 MG tablet Take 150 mg by mouth 2 (two) times daily as needed for heartburn.   Yes [provider]  sotalol  (BETAPACE) 80 MG tablet Take 1 tablet (80 mg total) by mouth daily. 06/23/17  Yes Henreitta Leber, MD  tamsulosin (FLOMAX) 0.4 MG CAPS capsule Take 1 capsule by mouth daily.   Yes [provider]  VITAMIN E PO Take 1 capsule by mouth 2 (two) times daily.   Yes [provider]  blood glucose meter kit and supplies KIT Dispense based on patient and insurance preference. Use up to four times daily as directed. (FOR ICD-9 250.00, 250.01). 06/23/17   Henreitta Leber, MD  insulin aspart (NOVOLOG) 100 UNIT/ML injection Inject 4 Units into the skin 3 (three) times daily with meals. Patient not taking: Reported on 12/17/2017 06/23/17   Henreitta Leber, MD  insulin aspart (NOVOLOG) 100 UNIT/ML injection TID w/ meals.    BS < 120 - 0 units BS 121-150 - 1 unit SQ BS 151-200 - 2 units SQ BS 201-250 - 3 units SQ BS 251-300 - 4 units SQ BS 301-350 - 5 units SQ BS 351-400 - 6 units SQ   > 400 Call PCP. Patient not taking: Reported on 12/17/2017 06/23/17   Henreitta Leber, MD  metFORMIN (GLUCOPHAGE) 500 MG tablet Take 1 tablet (500 mg total) by mouth 2 (two) times daily with a meal. 12/17/17 01/16/18  Merlyn Lot, MD  metoCLOPramide (REGLAN) 5 MG tablet Take 1 tablet (5 mg total) by mouth every 8 (eight) hours as needed for nausea. Patient not taking: Reported on 12/17/2017 08/24/17 08/24/18  Lavonia Drafts, MD  ondansetron (ZOFRAN ODT) 4 MG disintegrating tablet Take 1 tablet (4 mg total) by mouth every 8 (eight) hours as needed for nausea or vomiting. 03/10/17   Merlyn Lot, MD  sulfaSALAzine (AZULFIDINE) 500 MG tablet Take 2 tablets (1,000 mg total) by mouth 2 (two) times daily. 12/17/17   Merlyn Lot, MD    Allergies Patient has no active allergies.    Social History Social History   Tobacco Use  . Smoking status: Former Smoker    Packs/day: 4.00    Years: 25.00    Pack years: 100.00    Types: Cigarettes    Last attempt to quit: 09/28/1981    Years since  quitting: 36.2  . Smokeless tobacco: Never Used  . Tobacco comment: Quit about 40 years ago for about 16 years  Substance Use Topics  . Alcohol use: No  . Drug use: No    Review of Systems Patient denies headaches, rhinorrhea, blurry vision, numbness, shortness of breath, chest pain, edema, cough, abdominal pain, nausea, vomiting, diarrhea, dysuria, fevers, rashes or hallucinations unless otherwise stated above in HPI. ____________________________________________   PHYSICAL EXAM:  VITAL SIGNS: Vitals:   12/17/17 1200 12/17/17 1300  BP: 128/60 107/85  Pulse:  Resp: 12   Temp:    SpO2:      Constitutional: Alert and oriented. Ill and pale appearing Eyes: Conjunctivae are pale appearing Head: Atraumatic. Nose: No congestion/rhinnorhea. Mouth/Throat: Mucous membranes are moist.   Neck: No stridor. Painless ROM.  Cardiovascular: Normal rate, regular rhythm. Grossly normal heart sounds.  Good peripheral circulation. Respiratory: Normal respiratory effort.  No retractions. Lungs CTAB. Gastrointestinal: Soft and nontender. No distention. No abdominal bruits. No CVA tenderness. Genitourinary:  Musculoskeletal: No lower extremity tenderness nor edema.  No joint effusions. Neurologic:  Normal speech and language. No gross focal neurologic deficits are appreciated. No facial droop Skin:  Skin is warm, dry and intact. No rash noted. Psychiatric: Mood and affect are normal. Speech and behavior are normal.  ____________________________________________   LABS (all labs ordered are listed, but only abnormal results are displayed)  Results for orders placed or performed during the hospital encounter of 12/17/17 (from the past 24 hour(s))  Glucose, capillary     Status: Abnormal   Collection Time: 12/17/17  8:04 AM  Result Value Ref Range   Glucose-Capillary 110 (H) 65 - 99 mg/dL  CBC with Differential     Status: Abnormal   Collection Time: 12/17/17  8:09 AM  Result Value Ref  Range   WBC 3.8 3.8 - 10.6 K/uL   RBC 3.15 (L) 4.40 - 5.90 MIL/uL   Hemoglobin 9.2 (L) 13.0 - 18.0 g/dL   HCT 29.8 (L) 40.0 - 52.0 %   MCV 94.5 80.0 - 100.0 fL   MCH 29.2 26.0 - 34.0 pg   MCHC 30.9 (L) 32.0 - 36.0 g/dL   RDW 16.9 (H) 11.5 - 14.5 %   Platelets 70 (L) 150 - 440 K/uL   Neutrophils Relative % 59 %   Neutro Abs 2.3 1.4 - 6.5 K/uL   Lymphocytes Relative 22 %   Lymphs Abs 0.8 (L) 1.0 - 3.6 K/uL   Monocytes Relative 16 %   Monocytes Absolute 0.6 0.2 - 1.0 K/uL   Eosinophils Relative 2 %   Eosinophils Absolute 0.1 0 - 0.7 K/uL   Basophils Relative 1 %   Basophils Absolute 0.0 0 - 0.1 K/uL  Comprehensive metabolic panel     Status: Abnormal   Collection Time: 12/17/17  8:09 AM  Result Value Ref Range   Sodium 138 135 - 145 mmol/L   Potassium 4.4 3.5 - 5.1 mmol/L   Chloride 108 101 - 111 mmol/L   CO2 25 22 - 32 mmol/L   Glucose, Bld 121 (H) 65 - 99 mg/dL   BUN 21 (H) 6 - 20 mg/dL   Creatinine, Ser 0.99 0.61 - 1.24 mg/dL   Calcium 8.1 (L) 8.9 - 10.3 mg/dL   Total Protein 5.9 (L) 6.5 - 8.1 g/dL   Albumin 3.0 (L) 3.5 - 5.0 g/dL   AST 32 15 - 41 U/L   ALT 20 17 - 63 U/L   Alkaline Phosphatase 132 (H) 38 - 126 U/L   Total Bilirubin 1.1 0.3 - 1.2 mg/dL   GFR calc non Af Amer >60 >60 mL/min   GFR calc Af Amer >60 >60 mL/min   Anion gap 5 5 - 15  Lactic acid, plasma     Status: None   Collection Time: 12/17/17  8:13 AM  Result Value Ref Range   Lactic Acid, Venous 1.4 0.5 - 1.9 mmol/L  Urinalysis, Complete w Microscopic     Status: Abnormal   Collection Time: 12/17/17  8:19 AM  Result Value Ref Range   Color, Urine YELLOW (A) YELLOW   APPearance CLEAR (A) CLEAR   Specific Gravity, Urine 1.017 1.005 - 1.030   pH 5.0 5.0 - 8.0   Glucose, UA 50 (A) NEGATIVE mg/dL   Hgb urine dipstick NEGATIVE NEGATIVE   Bilirubin Urine NEGATIVE NEGATIVE   Ketones, ur NEGATIVE NEGATIVE mg/dL   Protein, ur NEGATIVE NEGATIVE mg/dL   Nitrite NEGATIVE NEGATIVE   Leukocytes, UA TRACE (A)  NEGATIVE   RBC / HPF 6-30 0 - 5 RBC/hpf   WBC, UA 6-30 0 - 5 WBC/hpf   Bacteria, UA NONE SEEN NONE SEEN   Squamous Epithelial / LPF 0-5 (A) NONE SEEN   WBC Clumps PRESENT   Glucose, capillary     Status: None   Collection Time: 12/17/17  8:50 AM  Result Value Ref Range   Glucose-Capillary 94 65 - 99 mg/dL  Glucose, capillary     Status: None   Collection Time: 12/17/17 10:03 AM  Result Value Ref Range   Glucose-Capillary 87 65 - 99 mg/dL  Influenza panel by PCR (type A & B)     Status: None   Collection Time: 12/17/17 10:04 AM  Result Value Ref Range   Influenza A By PCR NEGATIVE NEGATIVE   Influenza B By PCR NEGATIVE NEGATIVE  Glucose, capillary     Status: Abnormal   Collection Time: 12/17/17 12:47 PM  Result Value Ref Range   Glucose-Capillary 105 (H) 65 - 99 mg/dL   ____________________________________________  EKG My review and personal interpretation at Time: 8:09   Indication: hypoglcyemia  Rate: 50  Rhythm: sinus Axis: normal Other: normal intervals, no stemi ____________________________________________  RADIOLOGY  I personally reviewed all radiographic images ordered to evaluate for the above acute complaints and reviewed radiology reports and findings.  These findings were personally discussed with the patient.  Please see medical record for radiology report.  ____________________________________________   PROCEDURES  Procedure(s) performed:  Procedures    Critical Care performed: no ____________________________________________   INITIAL IMPRESSION / ASSESSMENT AND PLAN / ED COURSE  Pertinent labs & imaging results that were available during my care of the patient were reviewed by me and considered in my medical decision making (see chart for details).  DDX: Dehydration, sepsis, pna, uti, hypoglycemia, cva, drug effect, withdrawal, encephalitis   Breylon Sherrow is a 72 y.o. who presents to the ED with altered mental status hypothermia and  reported low glucose.  Patient seems to be improving after administration of glucose and IV fluids.  No report of trauma.  Family reports the patient has done this once before in the medical record patient was admitted in September for similar symptoms which resolved with observation.  Abdominal exam is soft and benign.  Is no focal neuro deficits seems more drowsy.  Will evaluate for medication abnormality.  Will evaluate for sepsis given his hypothermia.  Will place patient on cardiac monitor.  Clinical Course as of Dec 17 1401  Fri Dec 17, 2017  1000 Rectal exam guaiac negative.  Patient still drowsy appearing and still hypothermic to 93.   [PR]  3267 Does not seem to have any source or signs of sepsis.  No evidence of active GI bleed.  Will order CT imaging is a patient is slightly confused.   [PR]  1205 Patient reassessed.  Requesting something to eat.  Temperature improved.  Will reassess.   [PR]  1324 Patient reassessed.  He is in no acute distress.  Able to  ambulate with steady gait.  Symptoms seem to have resolved.  Most likely he had an episode of symptomatic hypoglycemia that is since resolved.  No evidence of infectious process.  Patient requesting discharge home and wife at bedside feels that that is appropriate and agrees with taking him home.  Did encourage patient return to the ER as well as follow-up PCP regarding glucose management.   [PR]    Clinical Course User Index [PR] Merlyn Lot, MD     As part of my medical decision making, I reviewed the following data within the [facility] notes reviewed and incorporated, Labs reviewed, notes from prior ED visits and [place] Controlled Substance Database   ____________________________________________   FINAL CLINICAL IMPRESSION(S) / ED DIAGNOSES  Final diagnoses:  Hypothermia, initial encounter  Hypoglycemia      NEW MEDICATIONS STARTED DURING THIS VISIT:  Current Discharge Medication List        Note:  This document was prepared using Dragon voice recognition software and may include unintentional dictation errors.    Merlyn Lot, MD 12/17/17 [phone]

## 2017-12-19 LAB — URINE CULTURE: Culture: 40000 — AB

## 2017-12-20 NOTE — Progress Notes (Addendum)
ED Antimicrobial Stewardship Positive Culture Follow Up   Bernard Pennington is an 72 y.o. male who presented to [facility] on 12/17/2017 with a chief complaint of  Chief Complaint  Patient presents with  . Hypoglycemia    Recent Results (from the past 720 hour(s))  Blood Culture (routine x 2)     Status: None (Preliminary result)   Collection Time: 12/17/17  8:19 AM  Result Value Ref Range Status   Specimen Description BLOOD BLOOD RIGHT ARM  Final   Special Requests   Final    BOTTLES DRAWN AEROBIC AND ANAEROBIC Blood Culture results may not be optimal due to an excessive volume of blood received in culture bottles   Culture   Final    NO GROWTH 3 DAYS Performed at [facility], [address] [place], [place] [address]    Report Status PENDING  Incomplete  Blood Culture (routine x 2)     Status: None (Preliminary result)   Collection Time: 12/17/17  8:19 AM  Result Value Ref Range Status   Specimen Description BLOOD BLOOD LEFT ARM  Final   Special Requests   Final    BOTTLES DRAWN AEROBIC AND ANAEROBIC Blood Culture adequate volume   Culture   Final    NO GROWTH 3 DAYS Performed at [facility], [address], [place], [place] [address]    Report Status PENDING  Incomplete  Urine culture     Status: Abnormal   Collection Time: 12/17/17  8:19 AM  Result Value Ref Range Status   Specimen Description   Final    URINE, RANDOM Performed at [facility], [address], [place], [place] [address]    Special Requests   Final    NONE Performed at [facility], [address], [place], [place] [address]    Culture 40,000 COLONIES/mL ENTEROCOCCUS FAECALIS (A)  Final   Report Status 12/19/2017 FINAL  Final   Organism ID, Bacteria ENTEROCOCCUS FAECALIS (A)  Final      Susceptibility   Enterococcus faecalis - MIC*    AMPICILLIN <=2 SENSITIVE Sensitive     LEVOFLOXACIN 1 SENSITIVE Sensitive     NITROFURANTOIN <=16 SENSITIVE Sensitive    VANCOMYCIN 1 SENSITIVE Sensitive     * 40,000 COLONIES/mL ENTEROCOCCUS FAECALIS    []  Treated with no abx with discharge, organism resistant to prescribed antimicrobial [x]  Patient discharged originally without antimicrobial agent and treatment is now indicated  New antibiotic prescription: Amoxicillin 500mg  po bid x 10 days    ED Provider: Dr Alfred Levins   3/25@1345 ; Attempted to call patient at listed number of [phone], left VM to call pharmacist back. Will attempt again in a while   3/25@1630  Attempted again to call patient, left voicemail.    Donna Christen Rachella Basden 12/20/2017, 1:42 PM  Thomasenia Sales, PharmD, MBA, [facility]

## 2017-12-22 LAB — CULTURE, BLOOD (ROUTINE X 2)
CULTURE: NO GROWTH
Culture: NO GROWTH
SPECIAL REQUESTS: ADEQUATE

## 2018-01-13 ENCOUNTER — Other Ambulatory Visit: Payer: Self-pay | Admitting: Family Medicine

## 2018-01-13 ENCOUNTER — Ambulatory Visit
Admission: RE | Admit: 2018-01-13 | Discharge: 2018-01-13 | Disposition: A | Discharge status: Home / Self Care | Payer: Medicare Other | Source: Ambulatory Visit | Attending: Family Medicine | Admitting: Family Medicine

## 2018-01-13 ENCOUNTER — Emergency Department
Admission: EM | Admit: 2018-01-13 | Discharge: 2018-01-13 | Disposition: A | Discharge status: Home / Self Care | Payer: Medicare Other | Attending: Student in an Organized Health Care Education/Training Program | Admitting: Student in an Organized Health Care Education/Training Program

## 2018-01-13 ENCOUNTER — Other Ambulatory Visit: Payer: Self-pay

## 2018-01-13 DIAGNOSIS — Z8673 Personal history of transient ischemic attack (TIA), and cerebral infarction without residual deficits: Secondary | ICD-10-CM | POA: Insufficient documentation

## 2018-01-13 DIAGNOSIS — D649 Anemia, unspecified: Secondary | ICD-10-CM | POA: Diagnosis not present

## 2018-01-13 DIAGNOSIS — M25551 Pain in right hip: Secondary | ICD-10-CM

## 2018-01-13 DIAGNOSIS — Z96652 Presence of left artificial knee joint: Secondary | ICD-10-CM | POA: Insufficient documentation

## 2018-01-13 DIAGNOSIS — I10 Essential (primary) hypertension: Secondary | ICD-10-CM | POA: Insufficient documentation

## 2018-01-13 DIAGNOSIS — E119 Type 2 diabetes mellitus without complications: Secondary | ICD-10-CM | POA: Insufficient documentation

## 2018-01-13 DIAGNOSIS — Z79899 Other long term (current) drug therapy: Secondary | ICD-10-CM | POA: Insufficient documentation

## 2018-01-13 DIAGNOSIS — Z794 Long term (current) use of insulin: Secondary | ICD-10-CM | POA: Diagnosis not present

## 2018-01-13 DIAGNOSIS — Z87891 Personal history of nicotine dependence: Secondary | ICD-10-CM | POA: Insufficient documentation

## 2018-01-13 DIAGNOSIS — D696 Thrombocytopenia, unspecified: Secondary | ICD-10-CM | POA: Diagnosis not present

## 2018-01-13 DIAGNOSIS — R531 Weakness: Secondary | ICD-10-CM | POA: Diagnosis present

## 2018-01-13 DIAGNOSIS — Z7901 Long term (current) use of anticoagulants: Secondary | ICD-10-CM | POA: Diagnosis not present

## 2018-01-13 LAB — COMPREHENSIVE METABOLIC PANEL
ALK PHOS: 121 U/L (ref 38–126)
ALT: 26 U/L (ref 17–63)
ANION GAP: 6 (ref 5–15)
AST: 43 U/L — ABNORMAL HIGH (ref 15–41)
Albumin: 3.3 g/dL — ABNORMAL LOW (ref 3.5–5.0)
BILIRUBIN TOTAL: 0.9 mg/dL (ref 0.3–1.2)
BUN: 36 mg/dL — ABNORMAL HIGH (ref 6–20)
CALCIUM: 8.4 mg/dL — AB (ref 8.9–10.3)
CO2: 27 mmol/L (ref 22–32)
Chloride: 107 mmol/L (ref 101–111)
Creatinine, Ser: 1.51 mg/dL — ABNORMAL HIGH (ref 0.61–1.24)
GFR, EST AFRICAN AMERICAN: 51 mL/min — AB (ref >60)
GFR, EST NON AFRICAN AMERICAN: 44 mL/min — AB (ref >60)
Glucose, Bld: 244 mg/dL — ABNORMAL HIGH (ref 65–99)
POTASSIUM: 5 mmol/L (ref 3.5–5.1)
Sodium: 140 mmol/L (ref 135–145)
TOTAL PROTEIN: 5.7 g/dL — AB (ref 6.5–8.1)

## 2018-01-13 LAB — CBC
HEMATOCRIT: 25.6 % — AB (ref 40.0–52.0)
HEMOGLOBIN: 8.2 g/dL — AB (ref 13.0–18.0)
MCH: 29.5 pg (ref 26.0–34.0)
MCHC: 32 g/dL (ref 32.0–36.0)
MCV: 92.1 fL (ref 80.0–100.0)
Platelets: 88 10*3/uL — ABNORMAL LOW (ref 150–440)
RBC: 2.78 MIL/uL — ABNORMAL LOW (ref 4.40–5.90)
RDW: 20.7 % — AB (ref 11.5–14.5)
WBC: 3.5 10*3/uL — ABNORMAL LOW (ref 3.8–10.6)

## 2018-01-13 LAB — DIFFERENTIAL
BASOS ABS: 0 10*3/uL (ref 0–0.1)
Band Neutrophils: 0 %
Basophils Relative: 0 %
Blasts: 0 %
EOS ABS: 0 10*3/uL (ref 0–0.7)
EOS PCT: 1 %
Lymphocytes Relative: 25 %
Lymphs Abs: 0.9 10*3/uL — ABNORMAL LOW (ref 1.0–3.6)
METAMYELOCYTES PCT: 0 %
MONO ABS: 0.4 10*3/uL (ref 0.2–1.0)
MONOS PCT: 11 %
MYELOCYTES: 0 %
NEUTROS PCT: 63 %
Neutro Abs: 2.2 10*3/uL (ref 1.4–6.5)
OTHER: 0 %
Promyelocytes Relative: 0 %
nRBC: 0 /100 WBC

## 2018-01-13 LAB — TYPE AND SCREEN
ABO/RH(D): O NEG
Antibody Screen: NEGATIVE

## 2018-01-13 NOTE — ED Triage Notes (Signed)
Pt in with co generalized weakness, states recent dx with pneumonia 3 weeks, and anemia. Went to pmd and had blood work done, was called at home and told to come to ED for worsening anemia.

## 2018-01-13 NOTE — ED Notes (Signed)
ED Provider at bedside. 

## 2018-01-13 NOTE — ED Notes (Signed)
Patient ambulatory to lobby with steady gait and NAD noted. Verbalized understanding of discharge instructions and follow-up care.  

## 2018-01-13 NOTE — ED Provider Notes (Signed)
[facility] Emergency Department Provider Note    First MD Initiated Contact with Patient 01/13/18 2113     (approximate)  I have reviewed the triage vital signs and the nursing notes.   HISTORY  Chief Complaint Weakness    HPI Kayde Warehime is a 72 y.o. male presents to the ER with complaint of generalized weakness is been ongoing for the past several days and weeks.  Previously seen diagnosed with you 2 weeks and then subsequently with pneumonia.  Patient had routine blood work done by his PCP today and was noted to be anemic with thrombocytopenia and leukopenia as well as presents his in's of CKD.  Sent to the ER for evaluation.  Denies any melena or hematochezia.  Denies any bleeding from his gums.  No hematuria.  No trauma.  Has had some decreased oral intake but still eating.  Further questioning patient states that he has had a history of what sound to be cyclical episodes of pancytopenia.  Followed by hematology previously with a bone marrow biopsy showing no abnormality in the patient's blood count spontaneously resolved.  No new medications.  Past Medical History:  Diagnosis Date  . A-fib ([place])   . Achalasia   . BPH (benign prostatic hyperplasia)   . Chronic kidney disease    kidney stones  . Cirrhosis ([place])   . Constipation   . Diabetes mellitus without complication ([place])   . Dysrhythmia    a-fib  . Esophageal varices ([place])   . GERD (gastroesophageal reflux disease)   . Headache   . Hyperlipemia   . Hypertension   . Pancytopenia ([place])   . Sleep apnea   . TIA (transient ischemic attack)   . Ulcerative colitis ([place])    Family History  Problem Relation Age of Onset  . Heart attack Mother   . Stroke Father   . Cancer Father   . Diabetes Brother    Past Surgical History:  Procedure Laterality Date  . CHOLECYSTECTOMY    . COLONOSCOPY WITH PROPOFOL N/A 08/25/2017   Procedure: COLONOSCOPY WITH PROPOFOL;  Surgeon: Toledo, Benay Pike,  MD;  Location: [facility] ENDOSCOPY;  Service: Gastroenterology;  Laterality: N/A;  . ESOPHAGOGASTRODUODENOSCOPY (EGD) WITH PROPOFOL N/A 08/25/2017   Procedure: ESOPHAGOGASTRODUODENOSCOPY (EGD) WITH PROPOFOL;  Surgeon: Toledo, Benay Pike, MD;  Location: [facility] ENDOSCOPY;  Service: Gastroenterology;  Laterality: N/A;  . JOINT REPLACEMENT     L partial knee  . KIDNEY SURGERY    . LITHOTRIPSY    . VASECTOMY     Patient Active Problem List   Diagnosis Date Noted  . Hypoglycemia 06/22/2017  . TIA (transient ischemic attack) 08/19/2015      Prior to Admission medications   Medication Sig Start Date End Date Taking? Authorizing Provider  apixaban (ELIQUIS) 5 MG TABS tablet Take 5 mg by mouth every 12 (twelve) hours.    [provider]  Ascorbic Acid (VITAMIN C) 1000 MG tablet Take 1,000 mg by mouth 3 (three) times daily.    [provider]  blood glucose meter kit and supplies KIT Dispense based on patient and insurance preference. Use up to four times daily as directed. (FOR ICD-9 250.00, 250.01). 06/23/17   Henreitta Leber, MD  CINNAMON PO Take 1 tablet by mouth 3 (three) times daily before meals.    [provider]  cloNIDine (CATAPRES) 0.1 MG tablet Take 0.1 mg by mouth 2 (two) times daily.     [provider]  gabapentin (NEURONTIN)  300 MG capsule Take 900 mg by mouth 3 (three) times daily.    [provider]  insulin aspart (NOVOLOG) 100 UNIT/ML injection Inject 4 Units into the skin 3 (three) times daily with meals. Patient not taking: Reported on 12/17/2017 06/23/17   Henreitta Leber, MD  insulin aspart (NOVOLOG) 100 UNIT/ML injection TID w/ meals.    BS < 120 - 0 units BS 121-150 - 1 unit SQ BS 151-200 - 2 units SQ BS 201-250 - 3 units SQ BS 251-300 - 4 units SQ BS 301-350 - 5 units SQ BS 351-400 - 6 units SQ   > 400 Call PCP. Patient not taking: Reported on 12/17/2017 06/23/17   Henreitta Leber, MD  insulin NPH Human (HUMULIN N,NOVOLIN N)  100 UNIT/ML injection Inject 18 Units into the skin 2 (two) times daily.     [provider]  insulin regular (NOVOLIN R,HUMULIN R) 100 units/mL injection Inject 0-36 Units into the skin 3 (three) times daily before meals.    [provider]  magnesium oxide (MAG-OX) 400 MG tablet Take 400 mg by mouth 2 (two) times daily.    [provider]  metFORMIN (GLUCOPHAGE) 500 MG tablet Take 1 tablet (500 mg total) by mouth 2 (two) times daily with a meal. 12/17/17 01/16/18  Merlyn Lot, MD  metoCLOPramide (REGLAN) 5 MG tablet Take 1 tablet (5 mg total) by mouth every 8 (eight) hours as needed for nausea. Patient not taking: Reported on 12/17/2017 08/24/17 08/24/18  Lavonia Drafts, MD  mirtazapine (REMERON) 15 MG tablet Take 1 tablet by mouth at bedtime. 10/13/17   [provider]  Multiple Vitamin (MULTIVITAMIN WITH MINERALS) TABS tablet Take 1 tablet by mouth daily.    [provider]  niacin 500 MG tablet Take 1,000 mg by mouth 2 (two) times daily.    [provider]  Omega-3 Fatty Acids (FISH OIL) 1000 MG CAPS Take 1,000 mg by mouth daily.    [provider]  omeprazole (PRILOSEC) 20 MG capsule Take 40 mg by mouth daily.    [provider]  ondansetron (ZOFRAN ODT) 4 MG disintegrating tablet Take 1 tablet (4 mg total) by mouth every 8 (eight) hours as needed for nausea or vomiting. 03/10/17   Merlyn Lot, MD  ranitidine (ZANTAC) 150 MG tablet Take 150 mg by mouth 2 (two) times daily as needed for heartburn.    [provider]  sotalol (BETAPACE) 80 MG tablet Take 1 tablet (80 mg total) by mouth daily. 06/23/17   Henreitta Leber, MD  sulfaSALAzine (AZULFIDINE) 500 MG tablet Take 2 tablets (1,000 mg total) by mouth 2 (two) times daily. 12/17/17   Merlyn Lot, MD  tamsulosin (FLOMAX) 0.4 MG CAPS capsule Take 1 capsule by mouth daily.    [provider]  VITAMIN E PO Take 1 capsule by mouth 2 (two) times  daily.    [provider]    Allergies Patient has no known allergies.    Social History Social History   Tobacco Use  . Smoking status: Former Smoker    Packs/day: 4.00    Years: 25.00    Pack years: 100.00    Types: Cigarettes    Last attempt to quit: 09/28/1981    Years since quitting: 36.3  . Smokeless tobacco: Never Used  . Tobacco comment: Quit about 40 years ago for about 16 years  Substance Use Topics  . Alcohol use: No  . Drug use: No  Review of Systems Patient denies headaches, rhinorrhea, blurry vision, numbness, shortness of breath, chest pain, edema, cough, abdominal pain, nausea, vomiting, diarrhea, dysuria, fevers, rashes or hallucinations unless otherwise stated above in HPI. ____________________________________________   PHYSICAL EXAM:  VITAL SIGNS: Vitals:   01/13/18 2016  BP: 136/71  Pulse: 63  Resp: 16  Temp: 98.6 F (37 C)  SpO2: 98%    Constitutional: Alert and oriented. Well appearing and in no acute distress. Eyes: Conjunctivae are normal.  Head: Atraumatic. Nose: No congestion/rhinnorhea. Mouth/Throat: Mucous membranes are moist.   Neck: No stridor. Painless ROM.  Cardiovascular: Normal rate, regular rhythm. Grossly normal heart sounds.  Good peripheral circulation. Respiratory: Normal respiratory effort.  No retractions. Lungs CTAB. Gastrointestinal: Soft and nontender. No distention. No abdominal bruits. No CVA tenderness. Genitourinary: Rectal exam with no melena and stool is guaiac negative Musculoskeletal: No lower extremity tenderness nor edema.  No joint effusions. Neurologic:  Normal speech and language. No gross focal neurologic deficits are appreciated. No facial droop Skin:  Skin is warm, dry and intact. No rash noted. Psychiatric: Mood and affect are normal. Speech and behavior are normal.  ____________________________________________   LABS (all labs ordered are listed, but only abnormal results are  displayed)  Results for orders placed or performed during the hospital encounter of 01/13/18 (from the past 24 hour(s))  CBC     Status: Abnormal   Collection Time: 01/13/18  8:24 PM  Result Value Ref Range   WBC 3.5 (L) 3.8 - 10.6 K/uL   RBC 2.78 (L) 4.40 - 5.90 MIL/uL   Hemoglobin 8.2 (L) 13.0 - 18.0 g/dL   HCT 25.6 (L) 40.0 - 52.0 %   MCV 92.1 80.0 - 100.0 fL   MCH 29.5 26.0 - 34.0 pg   MCHC 32.0 32.0 - 36.0 g/dL   RDW 20.7 (H) 11.5 - 14.5 %   Platelets 88 (L) 150 - 440 K/uL  Comprehensive metabolic panel     Status: Abnormal   Collection Time: 01/13/18  8:24 PM  Result Value Ref Range   Sodium 140 135 - 145 mmol/L   Potassium 5.0 3.5 - 5.1 mmol/L   Chloride 107 101 - 111 mmol/L   CO2 27 22 - 32 mmol/L   Glucose, Bld 244 (H) 65 - 99 mg/dL   BUN 36 (H) 6 - 20 mg/dL   Creatinine, Ser 1.51 (H) 0.61 - 1.24 mg/dL   Calcium 8.4 (L) 8.9 - 10.3 mg/dL   Total Protein 5.7 (L) 6.5 - 8.1 g/dL   Albumin 3.3 (L) 3.5 - 5.0 g/dL   AST 43 (H) 15 - 41 U/L   ALT 26 17 - 63 U/L   Alkaline Phosphatase 121 38 - 126 U/L   Total Bilirubin 0.9 0.3 - 1.2 mg/dL   GFR calc non Af Amer 44 (L) >60 mL/min   GFR calc Af Amer 51 (L) >60 mL/min   Anion gap 6 5 - 15  Type and screen [facility]     Status: None (Preliminary result)   Collection Time: 01/13/18  8:24 PM  Result Value Ref Range   ABO/RH(D) PENDING    Antibody Screen PENDING    Sample Expiration      01/16/2018 Performed at [facility], [address], [place], [place] [address]    ____________________________________________ ____________________________________________  RADIOLOGY   ____________________________________________   PROCEDURES  Procedure(s) performed:  Procedures    Critical Care performed: no ____________________________________________   INITIAL IMPRESSION / ASSESSMENT AND  PLAN / ED COURSE  Pertinent labs & imaging results that were available during my care of the patient  were reviewed by me and considered in my medical decision making (see chart for details).  DDX: Anemia, iron deficiency anemia, pancytopenia, malignancy, infectious process, cirrhosis, CKD  Brier Reid is a 72 y.o. who presents to the ED with symptoms as described above.  Blood work does show steadily downtrending blood counts but patient not with any critically low hemoglobin or hematocrit.  He does not have any evidence of active bleeding or any recent traumas.  Renal dysfunction similar to previous.  At present the patient is asymptomatic therefore I do not believe he needs emergent transfusion at this time.  Will add on differential to exclude any evidence of malignant cell lines but anticipate patient will require referral and follow-up with hematology.   Clinical Course as of Jan 14 17  Thu Jan 13, 2018  2301 Differential shows no blasts myelocytes and promyelocytes.  At this point do not see any indication for emergent transfusion particularly as he is not terribly symptomatic and his hemoglobin is 8.4.  Denies any discomfort at this time.  To exam with guaiac negative stool.  At this point do believe patient stable and appropriate for outpatient follow-up.   [PR]    Clinical Course User Index [PR] Merlyn Lot, MD     As part of my medical decision making, I reviewed the following data within the [place] notes reviewed and incorporated, Labs reviewed, notes from prior ED visits and [place] Controlled Substance Database   ____________________________________________   FINAL CLINICAL IMPRESSION(S) / ED DIAGNOSES  Final diagnoses:  Anemia, unspecified type  Thrombocytopenia ([place])      NEW MEDICATIONS STARTED DURING THIS VISIT:  New Prescriptions   No medications on file     Note:  This document was prepared using Dragon voice recognition software and may include unintentional dictation errors.    Merlyn Lot, MD 01/14/18  [phone]

## 2018-01-13 NOTE — ED Notes (Signed)
Pt's Blood sugar was 215

## 2018-01-13 NOTE — Discharge Instructions (Addendum)
Please follow-up with PCP.  Please follow-up with hematology regarding her abnormal blood test.  Return immediately for any worsening chest pain, shortness of breath, weakness or difficulty standing.

## 2018-01-14 LAB — GLUCOSE, CAPILLARY: Glucose-Capillary: 215 mg/dL — ABNORMAL HIGH (ref 65–99)

## 2020-03-22 ENCOUNTER — Encounter: Payer: Self-pay | Admitting: Internal Medicine

## 2020-03-22 ENCOUNTER — Inpatient Hospital Stay
Admission: EM | Admit: 2020-03-22 | Discharge: 2020-03-24 | DRG: 638 | Disposition: A | Discharge status: Home / Self Care | Payer: Medicare Other | Attending: Internal Medicine | Admitting: Internal Medicine

## 2020-03-22 ENCOUNTER — Other Ambulatory Visit: Payer: Self-pay

## 2020-03-22 DIAGNOSIS — Z794 Long term (current) use of insulin: Secondary | ICD-10-CM

## 2020-03-22 DIAGNOSIS — I10 Essential (primary) hypertension: Secondary | ICD-10-CM | POA: Diagnosis not present

## 2020-03-22 DIAGNOSIS — R739 Hyperglycemia, unspecified: Secondary | ICD-10-CM

## 2020-03-22 DIAGNOSIS — E860 Dehydration: Secondary | ICD-10-CM | POA: Diagnosis present

## 2020-03-22 DIAGNOSIS — Z96652 Presence of left artificial knee joint: Secondary | ICD-10-CM | POA: Diagnosis present

## 2020-03-22 DIAGNOSIS — E876 Hypokalemia: Secondary | ICD-10-CM | POA: Diagnosis present

## 2020-03-22 DIAGNOSIS — N179 Acute kidney failure, unspecified: Secondary | ICD-10-CM | POA: Diagnosis present

## 2020-03-22 DIAGNOSIS — E1310 Other specified diabetes mellitus with ketoacidosis without coma: Secondary | ICD-10-CM

## 2020-03-22 DIAGNOSIS — D509 Iron deficiency anemia, unspecified: Secondary | ICD-10-CM | POA: Diagnosis present

## 2020-03-22 DIAGNOSIS — Z833 Family history of diabetes mellitus: Secondary | ICD-10-CM

## 2020-03-22 DIAGNOSIS — F329 Major depressive disorder, single episode, unspecified: Secondary | ICD-10-CM | POA: Diagnosis present

## 2020-03-22 DIAGNOSIS — Z809 Family history of malignant neoplasm, unspecified: Secondary | ICD-10-CM

## 2020-03-22 DIAGNOSIS — E1122 Type 2 diabetes mellitus with diabetic chronic kidney disease: Secondary | ICD-10-CM | POA: Diagnosis present

## 2020-03-22 DIAGNOSIS — R112 Nausea with vomiting, unspecified: Secondary | ICD-10-CM | POA: Diagnosis not present

## 2020-03-22 DIAGNOSIS — E1110 Type 2 diabetes mellitus with ketoacidosis without coma: Principal | ICD-10-CM

## 2020-03-22 DIAGNOSIS — Z79899 Other long term (current) drug therapy: Secondary | ICD-10-CM

## 2020-03-22 DIAGNOSIS — K219 Gastro-esophageal reflux disease without esophagitis: Secondary | ICD-10-CM

## 2020-03-22 DIAGNOSIS — R29810 Facial weakness: Secondary | ICD-10-CM | POA: Diagnosis present

## 2020-03-22 DIAGNOSIS — I480 Paroxysmal atrial fibrillation: Secondary | ICD-10-CM | POA: Diagnosis present

## 2020-03-22 DIAGNOSIS — N400 Enlarged prostate without lower urinary tract symptoms: Secondary | ICD-10-CM | POA: Diagnosis present

## 2020-03-22 DIAGNOSIS — I482 Chronic atrial fibrillation, unspecified: Secondary | ICD-10-CM

## 2020-03-22 DIAGNOSIS — Z8673 Personal history of transient ischemic attack (TIA), and cerebral infarction without residual deficits: Secondary | ICD-10-CM

## 2020-03-22 DIAGNOSIS — E119 Type 2 diabetes mellitus without complications: Secondary | ICD-10-CM

## 2020-03-22 DIAGNOSIS — Z7901 Long term (current) use of anticoagulants: Secondary | ICD-10-CM

## 2020-03-22 DIAGNOSIS — E785 Hyperlipidemia, unspecified: Secondary | ICD-10-CM

## 2020-03-22 DIAGNOSIS — Z8249 Family history of ischemic heart disease and other diseases of the circulatory system: Secondary | ICD-10-CM

## 2020-03-22 DIAGNOSIS — Z87891 Personal history of nicotine dependence: Secondary | ICD-10-CM

## 2020-03-22 DIAGNOSIS — Z20822 Contact with and (suspected) exposure to covid-19: Secondary | ICD-10-CM | POA: Diagnosis present

## 2020-03-22 DIAGNOSIS — I129 Hypertensive chronic kidney disease with stage 1 through stage 4 chronic kidney disease, or unspecified chronic kidney disease: Secondary | ICD-10-CM | POA: Diagnosis present

## 2020-03-22 DIAGNOSIS — G4752 REM sleep behavior disorder: Secondary | ICD-10-CM | POA: Diagnosis present

## 2020-03-22 DIAGNOSIS — K7469 Other cirrhosis of liver: Secondary | ICD-10-CM

## 2020-03-22 DIAGNOSIS — E861 Hypovolemia: Secondary | ICD-10-CM | POA: Diagnosis present

## 2020-03-22 DIAGNOSIS — N189 Chronic kidney disease, unspecified: Secondary | ICD-10-CM | POA: Diagnosis present

## 2020-03-22 DIAGNOSIS — Z823 Family history of stroke: Secondary | ICD-10-CM

## 2020-03-22 DIAGNOSIS — G4730 Sleep apnea, unspecified: Secondary | ICD-10-CM | POA: Diagnosis present

## 2020-03-22 DIAGNOSIS — R197 Diarrhea, unspecified: Secondary | ICD-10-CM

## 2020-03-22 LAB — CBC
HCT: 46.7 % (ref 39.0–52.0)
Hemoglobin: 16.8 g/dL (ref 13.0–17.0)
MCH: 33.2 pg (ref 26.0–34.0)
MCHC: 36 g/dL (ref 30.0–36.0)
MCV: 92.3 fL (ref 80.0–100.0)
Platelets: 123 10*3/uL — ABNORMAL LOW (ref 150–400)
RBC: 5.06 MIL/uL (ref 4.22–5.81)
RDW: 12.9 % (ref 11.5–15.5)
WBC: 7.8 10*3/uL (ref 4.0–10.5)
nRBC: 0 % (ref 0.0–0.2)

## 2020-03-22 LAB — GLUCOSE, CAPILLARY
Glucose-Capillary: 111 mg/dL — ABNORMAL HIGH (ref 70–99)
Glucose-Capillary: 138 mg/dL — ABNORMAL HIGH (ref 70–99)
Glucose-Capillary: 165 mg/dL — ABNORMAL HIGH (ref 70–99)
Glucose-Capillary: 169 mg/dL — ABNORMAL HIGH (ref 70–99)
Glucose-Capillary: 190 mg/dL — ABNORMAL HIGH (ref 70–99)
Glucose-Capillary: 242 mg/dL — ABNORMAL HIGH (ref 70–99)
Glucose-Capillary: 312 mg/dL — ABNORMAL HIGH (ref 70–99)
Glucose-Capillary: 352 mg/dL — ABNORMAL HIGH (ref 70–99)
Glucose-Capillary: 432 mg/dL — ABNORMAL HIGH (ref 70–99)

## 2020-03-22 LAB — URINALYSIS, COMPLETE (UACMP) WITH MICROSCOPIC
Bacteria, UA: NONE SEEN
Bilirubin Urine: NEGATIVE
Glucose, UA: >=500 mg/dL — AB
Hgb urine dipstick: NEGATIVE
Ketones, ur: 20 mg/dL — AB
Leukocytes,Ua: NEGATIVE
Nitrite: NEGATIVE
Protein, ur: 30 mg/dL — AB
Specific Gravity, Urine: 1.024 (ref 1.005–1.030)
Squamous Epithelial / LPF: NONE SEEN (ref 0–5)
pH: 5 (ref 5.0–8.0)

## 2020-03-22 LAB — BLOOD GAS, VENOUS
Acid-Base Excess: 0.2 mmol/L (ref 0.0–2.0)
Bicarbonate: 25.4 mmol/L (ref 20.0–28.0)
O2 Saturation: 83 %
Patient temperature: 37
pCO2, Ven: 42 mmHg — ABNORMAL LOW (ref 44.0–60.0)
pH, Ven: 7.39 (ref 7.250–7.430)
pO2, Ven: 48 mmHg — ABNORMAL HIGH (ref 32.0–45.0)

## 2020-03-22 LAB — BASIC METABOLIC PANEL
Anion gap: 11 (ref 5–15)
Anion gap: 9 (ref 5–15)
BUN: 16 mg/dL (ref 8–23)
BUN: 14 mg/dL (ref 8–23)
CO2: 23 mmol/L (ref 22–32)
CO2: 28 mmol/L (ref 22–32)
Calcium: 8.4 mg/dL — ABNORMAL LOW (ref 8.9–10.3)
Calcium: 8.8 mg/dL — ABNORMAL LOW (ref 8.9–10.3)
Chloride: 99 mmol/L (ref 98–111)
Chloride: 101 mmol/L (ref 98–111)
Creatinine, Ser: 1.17 mg/dL (ref 0.61–1.24)
Creatinine, Ser: 1.04 mg/dL (ref 0.61–1.24)
GFR calc Af Amer: >60 mL/min (ref >60)
GFR calc Af Amer: >60 mL/min (ref >60)
GFR calc non Af Amer: >60 mL/min (ref >60)
GFR calc non Af Amer: >60 mL/min (ref >60)
Glucose, Bld: 456 mg/dL — ABNORMAL HIGH (ref 70–99)
Glucose, Bld: 181 mg/dL — ABNORMAL HIGH (ref 70–99)
Potassium: 3.6 mmol/L (ref 3.5–5.1)
Potassium: 3.7 mmol/L (ref 3.5–5.1)
Sodium: 133 mmol/L — ABNORMAL LOW (ref 135–145)
Sodium: 138 mmol/L (ref 135–145)

## 2020-03-22 LAB — COMPREHENSIVE METABOLIC PANEL
ALT: 40 U/L (ref 0–44)
AST: 37 U/L (ref 15–41)
Albumin: 4.3 g/dL (ref 3.5–5.0)
Alkaline Phosphatase: 143 U/L — ABNORMAL HIGH (ref 38–126)
Anion gap: 17 — ABNORMAL HIGH (ref 5–15)
BUN: 18 mg/dL (ref 8–23)
CO2: 24 mmol/L (ref 22–32)
Calcium: 9.4 mg/dL (ref 8.9–10.3)
Chloride: 95 mmol/L — ABNORMAL LOW (ref 98–111)
Creatinine, Ser: 1.34 mg/dL — ABNORMAL HIGH (ref 0.61–1.24)
GFR calc Af Amer: 60 mL/min — ABNORMAL LOW (ref >60)
GFR calc non Af Amer: 51 mL/min — ABNORMAL LOW (ref >60)
Glucose, Bld: 410 mg/dL — ABNORMAL HIGH (ref 70–99)
Potassium: 4.4 mmol/L (ref 3.5–5.1)
Sodium: 136 mmol/L (ref 135–145)
Total Bilirubin: 1.7 mg/dL — ABNORMAL HIGH (ref 0.3–1.2)
Total Protein: 7.7 g/dL (ref 6.5–8.1)

## 2020-03-22 LAB — LIPASE, BLOOD: Lipase: 24 U/L (ref 11–51)

## 2020-03-22 LAB — AMMONIA: Ammonia: 20 umol/L (ref 9–35)

## 2020-03-22 LAB — SARS CORONAVIRUS 2 BY RT PCR (HOSPITAL ORDER, PERFORMED IN ~~LOC~~ HOSPITAL LAB): SARS Coronavirus 2: NEGATIVE

## 2020-03-22 MED ORDER — INSULIN REGULAR(HUMAN) IN NACL 100-0.9 UT/100ML-% IV SOLN
INTRAVENOUS | Status: DC
  Administered 2020-03-22: 15 [IU]/h via INTRAVENOUS
  Filled 2020-03-22: qty 100

## 2020-03-22 MED ORDER — GABAPENTIN 300 MG PO CAPS
900.0000 mg | ORAL_CAPSULE | Freq: Three times a day (TID) | ORAL | Status: DC
  Administered 2020-03-22 – 2020-03-23 (×2): 900 mg via ORAL
  Filled 2020-03-22 (×2): qty 3

## 2020-03-22 MED ORDER — PANTOPRAZOLE SODIUM 40 MG PO TBEC
40.0000 mg | DELAYED_RELEASE_TABLET | Freq: Every day | ORAL | Status: DC
  Administered 2020-03-22 – 2020-03-24 (×3): 40 mg via ORAL
  Filled 2020-03-22 (×4): qty 1

## 2020-03-22 MED ORDER — ADULT MULTIVITAMIN W/MINERALS CH
1.0000 | ORAL_TABLET | Freq: Every day | ORAL | Status: DC
  Administered 2020-03-22 – 2020-03-24 (×3): 1 via ORAL
  Filled 2020-03-22 (×4): qty 1

## 2020-03-22 MED ORDER — ASPIRIN EC 81 MG PO TBEC
81.0000 mg | DELAYED_RELEASE_TABLET | Freq: Every day | ORAL | Status: DC
  Administered 2020-03-22 – 2020-03-24 (×3): 81 mg via ORAL
  Filled 2020-03-22 (×3): qty 1

## 2020-03-22 MED ORDER — ONDANSETRON HCL 4 MG PO TABS: 4.0000 mg | ORAL_TABLET | Freq: Four times a day (QID) | ORAL | Status: DC | PRN

## 2020-03-22 MED ORDER — APIXABAN 5 MG PO TABS
5.0000 mg | ORAL_TABLET | Freq: Two times a day (BID) | ORAL | Status: DC
  Administered 2020-03-22 – 2020-03-24 (×4): 5 mg via ORAL
  Filled 2020-03-22 (×4): qty 1

## 2020-03-22 MED ORDER — SODIUM CHLORIDE 0.9 % IV BOLUS
1000.0000 mL | Freq: Once | INTRAVENOUS | Status: AC
  Administered 2020-03-22: 1000 mL via INTRAVENOUS

## 2020-03-22 MED ORDER — CLONIDINE HCL 0.1 MG PO TABS
0.1000 mg | ORAL_TABLET | Freq: Two times a day (BID) | ORAL | Status: DC
Start: 2020-03-22 — End: 2020-03-24
  Administered 2020-03-22 – 2020-03-24 (×4): 0.1 mg via ORAL
  Filled 2020-03-22 (×4): qty 1

## 2020-03-22 MED ORDER — POTASSIUM CHLORIDE 10 MEQ/100ML IV SOLN
10.0000 meq | INTRAVENOUS | Status: AC
  Administered 2020-03-22 (×2): 10 meq via INTRAVENOUS
  Filled 2020-03-22 (×2): qty 100

## 2020-03-22 MED ORDER — MIRTAZAPINE 15 MG PO TABS
15.0000 mg | ORAL_TABLET | Freq: Every day | ORAL | Status: DC
Start: 2020-03-22 — End: 2020-03-24
  Administered 2020-03-22 – 2020-03-23 (×2): 15 mg via ORAL
  Filled 2020-03-22 (×2): qty 1

## 2020-03-22 MED ORDER — MAGNESIUM OXIDE 400 (241.3 MG) MG PO TABS
400.0000 mg | ORAL_TABLET | Freq: Two times a day (BID) | ORAL | Status: DC
  Administered 2020-03-22 – 2020-03-24 (×4): 400 mg via ORAL
  Filled 2020-03-22 (×4): qty 1

## 2020-03-22 MED ORDER — OMEGA-3-ACID ETHYL ESTERS 1 G PO CAPS
1.0000 g | ORAL_CAPSULE | Freq: Every day | ORAL | Status: DC
  Administered 2020-03-22 – 2020-03-24 (×3): 1 g via ORAL
  Filled 2020-03-22 (×3): qty 1

## 2020-03-22 MED ORDER — SODIUM CHLORIDE 0.9 % IV SOLN
INTRAVENOUS | Status: DC
  Administered 2020-03-22: 75 mL via INTRAVENOUS

## 2020-03-22 MED ORDER — ONDANSETRON HCL 4 MG/2ML IJ SOLN
4.0000 mg | Freq: Four times a day (QID) | INTRAMUSCULAR | Status: DC | PRN
  Administered 2020-03-22: 4 mg via INTRAVENOUS
  Filled 2020-03-22: qty 2

## 2020-03-22 MED ORDER — DEXTROSE 50 % IV SOLN: 0.0000 mL | INTRAVENOUS | Status: DC | PRN

## 2020-03-22 MED ORDER — FAMOTIDINE 20 MG PO TABS
20.0000 mg | ORAL_TABLET | Freq: Every day | ORAL | Status: DC
  Administered 2020-03-22 – 2020-03-24 (×3): 20 mg via ORAL
  Filled 2020-03-22 (×3): qty 1

## 2020-03-22 MED ORDER — ACETAMINOPHEN 325 MG PO TABS: 650.0000 mg | ORAL_TABLET | Freq: Four times a day (QID) | ORAL | Status: DC | PRN

## 2020-03-22 MED ORDER — SODIUM CHLORIDE 0.9% FLUSH: 3.0000 mL | Freq: Once | INTRAVENOUS | Status: DC

## 2020-03-22 MED ORDER — MELATONIN 5 MG PO TABS
5.0000 mg | ORAL_TABLET | Freq: Every day | ORAL | Status: DC
  Administered 2020-03-22 – 2020-03-23 (×2): 5 mg via ORAL
  Filled 2020-03-22 (×2): qty 1

## 2020-03-22 MED ORDER — NIACIN 500 MG PO TABS
1000.0000 mg | ORAL_TABLET | Freq: Two times a day (BID) | ORAL | Status: DC
  Administered 2020-03-22 – 2020-03-24 (×4): 1000 mg via ORAL
  Filled 2020-03-22 (×6): qty 2

## 2020-03-22 MED ORDER — TAMSULOSIN HCL 0.4 MG PO CAPS
0.4000 mg | ORAL_CAPSULE | Freq: Every day | ORAL | Status: DC
  Administered 2020-03-22 – 2020-03-24 (×3): 0.4 mg via ORAL
  Filled 2020-03-22 (×4): qty 1

## 2020-03-22 MED ORDER — POTASSIUM CHLORIDE CRYS ER 20 MEQ PO TBCR
40.0000 meq | EXTENDED_RELEASE_TABLET | Freq: Once | ORAL | Status: AC
  Administered 2020-03-22: 40 meq via ORAL
  Filled 2020-03-22: qty 2

## 2020-03-22 MED ORDER — DEXTROSE-NACL 5-0.45 % IV SOLN: INTRAVENOUS | Status: DC

## 2020-03-22 MED ORDER — SOTALOL HCL 80 MG PO TABS
80.0000 mg | ORAL_TABLET | Freq: Every day | ORAL | Status: DC
  Administered 2020-03-22 – 2020-03-24 (×3): 80 mg via ORAL
  Filled 2020-03-22 (×3): qty 1

## 2020-03-22 MED ORDER — ASCORBIC ACID 500 MG PO TABS
1000.0000 mg | ORAL_TABLET | Freq: Three times a day (TID) | ORAL | Status: DC
  Administered 2020-03-22 – 2020-03-24 (×5): 1000 mg via ORAL
  Filled 2020-03-22 (×6): qty 2

## 2020-03-22 MED ORDER — ACETAMINOPHEN 650 MG RE SUPP: 650.0000 mg | Freq: Four times a day (QID) | RECTAL | Status: DC | PRN

## 2020-03-22 MED ORDER — ONDANSETRON 4 MG PO TBDP
4.0000 mg | ORAL_TABLET | Freq: Once | ORAL | Status: AC | PRN
  Administered 2020-03-22: 4 mg via ORAL
  Filled 2020-03-22: qty 1

## 2020-03-22 NOTE — H&P (Signed)
History and Physical    Bernard Pennington GLO:[id number] DOB: Feb 17, 1946 DOA: 03/22/2020  PCP: Valera Castle, MD   Patient coming from: Home   Chief Complaint: nausea and vomiting.   HPI: Bernard Pennington is a 74 y.o. adult with medical history significant of atrial fibrillation, type 2 diabetes mellitus, hypertension, dyslipidemia, GERD, cirrhosis, BPH and ulcerative colitis.    Patient reports 4 days of not feeling well, consistent with generalized malaise, nausea and vomiting.  Unable to take anything by mouth.  His symptoms were persistent, moderate to severe intensity, no improving worsening factors, associated with generalized abdominal pain. The start of his symptoms are coincident he running out of long-acting insulin, he was able to use short acting insulin for the following 2 days but then lost his capillary glucose measurement device.   Due to persistent symptoms he came to the hospital for further evaluation.  ED Course: Patient was found dehydrated, his glucose was 410, his anion gap was 17, he received 1 L of normal saline intravenously and was started on insulin drip.  Referred for admission for evaluation.   Review of Systems:  1. General: No fevers, no chills, no weight gain or weight loss 2. ENT: No runny nose or sore throat, no hearing disturbances 3. Pulmonary: No dyspnea, cough, wheezing, or hemoptysis 4. Cardiovascular: No angina, claudication, lower extremity edema, pnd or orthopnea 5. Gastrointestinal: positive nausea and vomiting, no diarrhea or constipation. Positive abdominal pain as mentioned in HPI.  6. Hematology: No easy bruisability or frequent infections 7. Urology: No dysuria, hematuria or increased urinary frequency 8. Dermatology: No rashes. 9. Neurology: No seizures or paresthesias 10. Musculoskeletal: No joint pain or deformities  Past Medical History:  Diagnosis Date  . A-fib ([place])   . Achalasia   . BPH (benign prostatic  hyperplasia)   . Chronic kidney disease    kidney stones  . Cirrhosis ([place])   . Constipation   . Diabetes mellitus without complication ([place])   . Dysrhythmia    a-fib  . Esophageal varices ([place])   . GERD (gastroesophageal reflux disease)   . Headache   . Hyperlipemia   . Hypertension   . Pancytopenia ([place])   . Sleep apnea   . TIA (transient ischemic attack)   . Ulcerative colitis [facility])     Past Surgical History:  Procedure Laterality Date  . CHOLECYSTECTOMY    . COLONOSCOPY WITH PROPOFOL N/A 08/25/2017   Procedure: COLONOSCOPY WITH PROPOFOL;  Surgeon: Toledo, Benay Pike, MD;  Location: [facility] ENDOSCOPY;  Service: Gastroenterology;  Laterality: N/A;  . ESOPHAGOGASTRODUODENOSCOPY (EGD) WITH PROPOFOL N/A 08/25/2017   Procedure: ESOPHAGOGASTRODUODENOSCOPY (EGD) WITH PROPOFOL;  Surgeon: Toledo, Benay Pike, MD;  Location: [facility] ENDOSCOPY;  Service: Gastroenterology;  Laterality: N/A;  . JOINT REPLACEMENT     L partial knee  . KIDNEY SURGERY    . LITHOTRIPSY    . VASECTOMY       reports that she quit smoking about 38 years ago. Her smoking use included cigarettes. She has a 100.00 pack-year smoking history. She has never used smokeless tobacco. She reports that she does not drink alcohol and does not use drugs.  No Known Allergies  Family History  Problem Relation Age of Onset  . Heart attack Mother   . Stroke Father   . Cancer Father   . Diabetes Brother      Prior to Admission medications   Medication Sig Start Date End Date Taking? Authorizing Provider  amLODipine (NORVASC) 5 MG tablet Take  5 mg by mouth daily. 02/29/20  Yes [provider]  Ascorbic Acid (VITAMIN C) 1000 MG tablet Take 1,000 mg by mouth 3 (three) times daily.   Yes [provider]  blood glucose meter kit and supplies KIT Dispense based on patient and insurance preference. Use up to four times daily as directed. (FOR ICD-9 250.00, 250.01). 06/23/17  Yes Sainani, Belia Heman, MD  Cinnamon 500 MG  capsule Take 500 mg by mouth 3 (three) times daily before meals.    Yes [provider]  citalopram (CELEXA) 20 MG tablet Take 20 mg by mouth daily. 03/11/20  Yes [provider]  cloNIDine (CATAPRES) 0.1 MG tablet Take 0.1 mg by mouth 2 (two) times daily.    Yes [provider]  ELIQUIS 2.5 MG TABS tablet Take 2.5 mg by mouth 2 (two) times daily. 01/09/20  Yes [provider]  ferrous sulfate 325 (65 FE) MG tablet Take 325 mg by mouth every other day.   Yes [provider]  finasteride (PROSCAR) 5 MG tablet Take 5 mg by mouth daily. 02/06/20  Yes [provider]  furosemide (LASIX) 20 MG tablet Take 20 mg by mouth daily as needed for fluid or edema.   Yes [provider]  gabapentin (NEURONTIN) 800 MG tablet Take 800 mg by mouth 3 (three) times daily.    Yes [provider]  insulin aspart (NOVOLOG) 100 UNIT/ML injection Inject 10 Units into the skin See admin instructions. Inject 10u (plus sliding scale) three times daily at mealtimes - max 50u daily   Yes [provider]  insulin NPH Human (HUMULIN N,NOVOLIN N) 100 UNIT/ML injection Inject 14-18 Units into the skin See admin instructions. Inject 18u under the skin every morning and inject 14u under the skin every night   Yes [provider]  Multiple Vitamin (MULTIVITAMIN WITH MINERALS) TABS tablet Take 1 tablet by mouth daily.   Yes [provider]  niacin (NIASPAN) 1000 MG CR tablet Take 1,000 mg by mouth 2 (two) times daily. 01/01/20  Yes [provider]  Omega-3 Fatty Acids (FISH OIL) 1000 MG CAPS Take 1,000 mg by mouth 2 (two) times daily.    Yes [provider]  omeprazole (PRILOSEC) 40 MG capsule Take 40 mg by mouth daily. 03/11/20  Yes [provider]  sotalol (BETAPACE) 80 MG tablet Take 1 tablet (80 mg total) by mouth daily. 06/23/17  Yes Henreitta Leber, MD  spironolactone (ALDACTONE) 25 MG tablet Take 25 mg by mouth  daily as needed.   Yes [provider]  tamsulosin (FLOMAX) 0.4 MG CAPS capsule Take 0.8 mg by mouth daily.    Yes [provider]    Physical Exam: Vitals:   03/22/20 1725 03/22/20 1726 03/22/20 1727 03/22/20 1729  BP:      Pulse:      Resp: _0 Temp:      TempSrc:      SpO2:      Weight:      Height:        Vitals:   03/22/20 1725 03/22/20 1726 03/22/20 1727 03/22/20 1729  BP:      Pulse:      Resp: _1 Temp:      TempSrc:      SpO2:      Weight:      Height:       General: deconditioned and ill looking appearing,  Neurology: Awake  and alert, non focal Head and Neck. Head normocephalic. Neck supple with no adenopathy or thyromegaly.   E ENT: positive pallor, no icterus, oral mucosa dry Cardiovascular: No JVD. S1-S2 present, rhythmic, no gallops, rubs, or murmurs. No lower extremity edema. Pulmonary: positive breath sounds bilaterally, adequate air movement, no wheezing, rhonchi or rales. Gastrointestinal. Abdomen with no organomegaly, non tender, no rebound or guarding Skin. No rashes Musculoskeletal: no joint deformities    Labs on Admission: I have personally reviewed following labs and imaging studies  CBC: Recent Labs  Lab 03/22/20 1302  WBC 7.8  HGB 16.8  HCT 46.7  MCV 92.3  PLT 629*   Basic Metabolic Panel: Recent Labs  Lab 03/22/20 1302 03/22/20 1713  NA 136 133*  K 4.4 3.6  CL 95* 99  CO2 24 23  GLUCOSE 410* 456*  BUN 18 16  CREATININE 1.34* 1.17  CALCIUM 9.4 8.4*   GFR: Estimated Creatinine Clearance (by C-G formula based on SCr of 1.17 mg/dL) Male: 53.8 mL/min Male: 65.4 mL/min Liver Function Tests: Recent Labs  Lab 03/22/20 1302  AST 37  ALT 40  ALKPHOS 143*  BILITOT 1.7*  PROT 7.7  ALBUMIN 4.3   Recent Labs  Lab 03/22/20 1302  LIPASE 24   Recent Labs  Lab 03/22/20 1303  AMMONIA 20   Coagulation Profile: No results for input(s): INR, PROTIME in the last 168 hours. Cardiac  Enzymes: No results for input(s): CKTOTAL, CKMB, CKMBINDEX, TROPONINI in the last 168 hours. BNP (last 3 results) No results for input(s): PROBNP in the last 8760 hours. HbA1C: No results for input(s): HGBA1C in the last 72 hours. CBG: Recent Labs  Lab 03/22/20 1406 03/22/20 1450 03/22/20 1554  GLUCAP 432* 352* 312*   Lipid Profile: No results for input(s): CHOL, HDL, LDLCALC, TRIG, CHOLHDL, LDLDIRECT in the last 72 hours. Thyroid Function Tests: No results for input(s): TSH, T4TOTAL, FREET4, T3FREE, THYROIDAB in the last 72 hours. Anemia Panel: No results for input(s): VITAMINB12, FOLATE, FERRITIN, TIBC, IRON, RETICCTPCT in the last 72 hours. Urine analysis:    Component Value Date/Time   COLORURINE YELLOW (A) 03/22/2020 1302   APPEARANCEUR HAZY (A) 03/22/2020 1302   LABSPEC 1.024 03/22/2020 1302   PHURINE 5.0 03/22/2020 1302   GLUCOSEU >=500 (A) 03/22/2020 1302   HGBUR NEGATIVE 03/22/2020 1302   BILIRUBINUR NEGATIVE 03/22/2020 1302   KETONESUR 20 (A) 03/22/2020 1302   PROTEINUR 30 (A) 03/22/2020 1302   NITRITE NEGATIVE 03/22/2020 1302   LEUKOCYTESUR NEGATIVE 03/22/2020 1302    Radiological Exams on Admission: No results found.  EKG: Independently reviewed. NA  Assessment/Plan Principal Problem:   DKA (diabetic ketoacidoses) (HCC) Active Problems:   Atrial fibrillation, chronic (HCC)   Dyslipidemia   Essential hypertension   GERD (gastroesophageal reflux disease)   Other cirrhosis of liver ([place])    74 year old male with multiple medical problems, he ran out of his long-acting insulin about 4 days ago, and unable to measure his capillary glucose for the last 2 days, consequently he has not been using any short acting insulin for the last 48 hours.  His symptoms have been worsening, associated with nausea, vomiting abdominal pain.  Unable to take p.o. intake.  On his initial physical examination blood pressure 165/96, heart rate 87, respiratory rate 14, oxygen  saturation 95%.  He had dry mucous membranes, mild pallor, lungs clear to auscultation bilaterally, heart S1-S2, present rhythmic, abdomen soft, no lower extremity edema. Sodium 136, potassium 4.4, chloride 95, bicarb 24, glucose 410,  BUN 18, creatinine 1.34, anion gap 17, white count 7.8, hemoglobin 16.8, hematocrit 46.7, platelet 123. Venous pH 7.39.  SARS COVID-19 negative.  Urinalysis glucose > 500, specific gravity 1.024, 6-10 white cells.  Patient will be admitted to the hospital with a working diagnosis of diabetes ketoacidosis.  1.  Imminent diabetes ketoacidosis.  Patient has been placed on insulin drip, in the emergency department, for glucose control. Patient does have an anion gap, his pH is 7.39 and a serum bicarb 24 with a chloride of 95.   Will continue insulin drip until anion gap is closed, advance diet with clear liquids.  When patient tolerating p.o. adequately will transition to subcutaneous insulin.  His follow-up chemistry is showing anion gap of 11 but glucose still at 456.   2.  Hypovolemic acute kidney injury with hypokalemia.  Continue hydration with isotonic saline, his creatinine is already trending down to 1.17 from 1.34.  Will replete potassium with potassium chloride and will check magnesium in the morning.  3.  Hypertension.  Continue blood pressure control with clonidine.   4.  Chronic atrial fibrillation.  Continue rate control with sotalol and anticoagulation with apixaban.  5.  Depression.  Continue mirtazapine.  6.  GERD.  Continue pantoprazole.  Status is: Observation  The patient remains OBS appropriate and will d/c before 2 midnights.  Dispo: The patient is from: Home              Anticipated d/c is to: Home              Anticipated d/c date is: 1 day              Patient currently is not medically stable to d/c.   DVT prophylaxis: apixaban   Code Status:   full Family Communication:  No family at the bedside    Consults called:  None    Admission status:  observation   Jatniel Verastegui Gerome Apley MD [facility]   03/22/2020, 6:08 PM

## 2020-03-22 NOTE — ED Triage Notes (Signed)
Pt states that he has been sick for the past 4 days, states that he has been vomiting and having diarrhea, chills, and shakes, pt states that he has taken some otc nausea med without relief

## 2020-03-22 NOTE — ED Notes (Signed)
Attempted to call report on pt to ICU. Per RN Lauren she will call me back for report

## 2020-03-22 NOTE — ED Notes (Signed)
Pt unable to void at this time. 

## 2020-03-22 NOTE — ED Notes (Signed)
Attempted to call report to ICU, per secretary the RN is giving report on another patient and will call me back

## 2020-03-22 NOTE — Plan of Care (Signed)

## 2020-03-22 NOTE — ED Provider Notes (Signed)
[facility] Emergency Department Provider Note ____________________________________________   First MD Initiated Contact with Patient 03/22/20 1345     (approximate)  I have reviewed the triage vital signs and the nursing notes.   HISTORY  Chief Complaint Abdominal Pain, Emesis, and Diarrhea    HPI Bernard Pennington is a 74 y.o. adult with PMH as noted below including diabetes on insulin who presents with nausea, vomiting, and diarrhea for approximately the last week, initially improved a few days ago but then returning the last several days.  It is associated with crampy abdominal discomfort but no sustained pain.  He denies any fever chills but does report generalized weakness.  He states that he has not been able to tolerate any p.o., and has not taken his normal doses of insulin since he is not eating.  Past Medical History:  Diagnosis Date   A-fib ([place])    Achalasia    BPH (benign prostatic hyperplasia)    Chronic kidney disease    kidney stones   Cirrhosis (HCC)    Constipation    Diabetes mellitus without complication (HCC)    Dysrhythmia    a-fib   Esophageal varices (HCC)    GERD (gastroesophageal reflux disease)    Headache    Hyperlipemia    Hypertension    Pancytopenia (HCC)    Sleep apnea    TIA (transient ischemic attack)    Ulcerative colitis [facility])     Patient Active Problem List   Diagnosis Date Noted   DKA (diabetic ketoacidoses) ([place]) 03/22/2020   Hypoglycemia 06/22/2017   TIA (transient ischemic attack) 08/19/2015    Past Surgical History:  Procedure Laterality Date   CHOLECYSTECTOMY     COLONOSCOPY WITH PROPOFOL N/A 08/25/2017   Procedure: COLONOSCOPY WITH PROPOFOL;  Surgeon: Toledo, Benay Pike, MD;  Location: [facility] ENDOSCOPY;  Service: Gastroenterology;  Laterality: N/A;   ESOPHAGOGASTRODUODENOSCOPY (EGD) WITH PROPOFOL N/A 08/25/2017   Procedure: ESOPHAGOGASTRODUODENOSCOPY (EGD) WITH PROPOFOL;   Surgeon: Toledo, Benay Pike, MD;  Location: [facility] ENDOSCOPY;  Service: Gastroenterology;  Laterality: N/A;   JOINT REPLACEMENT     L partial knee   KIDNEY SURGERY     LITHOTRIPSY     VASECTOMY      Prior to Admission medications   Medication Sig Start Date End Date Taking? Authorizing Provider  apixaban (ELIQUIS) 5 MG TABS tablet Take 5 mg by mouth every 12 (twelve) hours.    [provider]  Ascorbic Acid (VITAMIN C) 1000 MG tablet Take 1,000 mg by mouth 3 (three) times daily.    [provider]  blood glucose meter kit and supplies KIT Dispense based on patient and insurance preference. Use up to four times daily as directed. (FOR ICD-9 250.00, 250.01). 06/23/17   Henreitta Leber, MD  CINNAMON PO Take 1 tablet by mouth 3 (three) times daily before meals.    [provider]  cloNIDine (CATAPRES) 0.1 MG tablet Take 0.1 mg by mouth 2 (two) times daily.     [provider]  gabapentin (NEURONTIN) 300 MG capsule Take 900 mg by mouth 3 (three) times daily.    [provider]  insulin aspart (NOVOLOG) 100 UNIT/ML injection Inject 4 Units into the skin 3 (three) times daily with meals. Patient not taking: Reported on 12/17/2017 06/23/17   Henreitta Leber, MD  insulin aspart (NOVOLOG) 100 UNIT/ML injection TID w/ meals.    BS < 120 - 0 units BS 121-150 - 1 unit SQ BS 151-200 -  2 units SQ BS 201-250 - 3 units SQ BS 251-300 - 4 units SQ BS 301-350 - 5 units SQ BS 351-400 - 6 units SQ   > 400 Call PCP. Patient not taking: Reported on 12/17/2017 06/23/17   Henreitta Leber, MD  insulin NPH Human (HUMULIN N,NOVOLIN N) 100 UNIT/ML injection Inject 18 Units into the skin 2 (two) times daily.     [provider]  insulin regular (NOVOLIN R,HUMULIN R) 100 units/mL injection Inject 0-36 Units into the skin 3 (three) times daily before meals.    [provider]  magnesium oxide (MAG-OX) 400 MG tablet Take 400 mg by mouth 2 (two) times daily.     [provider]  metFORMIN (GLUCOPHAGE) 500 MG tablet Take 1 tablet (500 mg total) by mouth 2 (two) times daily with a meal. 12/17/17 01/16/18  Merlyn Lot, MD  metoCLOPramide (REGLAN) 5 MG tablet Take 1 tablet (5 mg total) by mouth every 8 (eight) hours as needed for nausea. Patient not taking: Reported on 12/17/2017 08/24/17 08/24/18  Lavonia Drafts, MD  mirtazapine (REMERON) 15 MG tablet Take 1 tablet by mouth at bedtime. 10/13/17   [provider]  Multiple Vitamin (MULTIVITAMIN WITH MINERALS) TABS tablet Take 1 tablet by mouth daily.    [provider]  niacin 500 MG tablet Take 1,000 mg by mouth 2 (two) times daily.    [provider]  Omega-3 Fatty Acids (FISH OIL) 1000 MG CAPS Take 1,000 mg by mouth daily.    [provider]  omeprazole (PRILOSEC) 20 MG capsule Take 40 mg by mouth daily.    [provider]  ondansetron (ZOFRAN ODT) 4 MG disintegrating tablet Take 1 tablet (4 mg total) by mouth every 8 (eight) hours as needed for nausea or vomiting. 03/10/17   Merlyn Lot, MD  ranitidine (ZANTAC) 150 MG tablet Take 150 mg by mouth 2 (two) times daily as needed for heartburn.    [provider]  sotalol (BETAPACE) 80 MG tablet Take 1 tablet (80 mg total) by mouth daily. 06/23/17   Henreitta Leber, MD  sulfaSALAzine (AZULFIDINE) 500 MG tablet Take 2 tablets (1,000 mg total) by mouth 2 (two) times daily. 12/17/17   Merlyn Lot, MD  tamsulosin (FLOMAX) 0.4 MG CAPS capsule Take 1 capsule by mouth daily.    [provider]  VITAMIN E PO Take 1 capsule by mouth 2 (two) times daily.    [provider]    Allergies Patient has no known allergies.  Family History  Problem Relation Age of Onset   Heart attack Mother    Stroke Father    Cancer Father    Diabetes Brother     Social History Social History   Tobacco Use   Smoking status: Former Smoker    Packs/day: 4.00    Years: 25.00     Pack years: 100.00    Types: Cigarettes    Quit date: 09/28/1981    Years since quitting: 38.5   Smokeless tobacco: Never Used   Tobacco comment: Quit about 40 years ago for about 16 years  Vaping Use   Vaping Use: Never used  Substance Use Topics   Alcohol use: No   Drug use: No    Review of Systems  Constitutional: No fever.  Positive for weakness. Eyes: No redness. ENT: No sore throat. Cardiovascular: Denies chest pain. Respiratory: Denies shortness of breath. Gastrointestinal: Positive for vomiting and diarrhea. Genitourinary: Negative for dysuria.  Musculoskeletal: Negative for  back pain. Skin: Negative for rash. Neurological: Negative for headache.   ____________________________________________   PHYSICAL EXAM:  VITAL SIGNS: ED Triage Vitals  Enc Vitals Group     BP 03/22/20 1253 (!) 154/111     Pulse Rate 03/22/20 1253 95     Resp 03/22/20 1253 (!) 22     Temp 03/22/20 1253 97.9 F (36.6 C)     Temp Source 03/22/20 1253 Oral     SpO2 03/22/20 1253 98 %     Weight 03/22/20 1254 210 lb (95.3 kg)     Height 03/22/20 1254 6' (1.829 m)     Head Circumference --      Peak Flow --      Pain Score 03/22/20 1254 3     Pain Loc --      Pain Edu? --      Excl. in [place]? --     Constitutional: Alert and oriented.  Somewhat weak appearing but in no acute distress. Eyes: Conjunctivae are normal.  No scleral icterus. Head: Atraumatic. Nose: No congestion/rhinnorhea. Mouth/Throat: Mucous membranes are dry.   Neck: Normal range of motion.  Cardiovascular: Normal rate, regular rhythm. Good peripheral circulation. Respiratory: Normal respiratory effort.  No retractions.  Gastrointestinal: Soft with mild diffuse discomfort but no focal tenderness or peritoneal signs.  No distention.  Genitourinary: No flank tenderness. Musculoskeletal:  Extremities warm and well perfused.  Neurologic:  Normal speech and language. No gross focal neurologic deficits are appreciated.    Skin:  Skin is warm and dry. No rash noted. Psychiatric: Mood and affect are normal. Speech and behavior are normal.  ____________________________________________   LABS (all labs ordered are listed, but only abnormal results are displayed)  Labs Reviewed  COMPREHENSIVE METABOLIC PANEL - Abnormal; Notable for the following components:      Result Value   Chloride 95 (*)    Glucose, Bld 410 (*)    Creatinine, Ser 1.34 (*)    Alkaline Phosphatase 143 (*)    Total Bilirubin 1.7 (*)    GFR calc non Af Amer 51 (*)    GFR calc Af Amer 60 (*)    Anion gap 17 (*)    All other components within normal limits  CBC - Abnormal; Notable for the following components:   Platelets 123 (*)    All other components within normal limits  BLOOD GAS, VENOUS - Abnormal; Notable for the following components:   pCO2, Ven 42 (*)    pO2, Ven 48.0 (*)    All other components within normal limits  GLUCOSE, CAPILLARY - Abnormal; Notable for the following components:   Glucose-Capillary 432 (*)    All other components within normal limits  GLUCOSE, CAPILLARY - Abnormal; Notable for the following components:   Glucose-Capillary 352 (*)    All other components within normal limits  GLUCOSE, CAPILLARY - Abnormal; Notable for the following components:   Glucose-Capillary 312 (*)    All other components within normal limits  SARS CORONAVIRUS 2 BY RT PCR (HOSPITAL ORDER, [place])  LIPASE, BLOOD  AMMONIA  URINALYSIS, COMPLETE (UACMP) WITH MICROSCOPIC  BASIC METABOLIC PANEL   ____________________________________________  EKG  _______________________________________  RADIOLOGY    ____________________________________________   PROCEDURES  Procedure(s) performed: No  Procedures  Critical Care performed: No ____________________________________________   INITIAL IMPRESSION / ASSESSMENT AND PLAN / ED COURSE  Pertinent labs & imaging results that were available  during my care of the patient were reviewed by me  and considered in my medical decision making (see chart for details).  74 year old male with PMH as noted above including diabetes on insulin presents with nausea, vomiting, and diarrhea as well as crampy abdominal pain over the last week.  I reviewed the past medical records in [place].  The patient was most recently seen in the ED in 2019 for generalized weakness.  He has had no visits or admissions since then.  On exam, the patient is somewhat weak and uncomfortable appearing, holding a garbage can from his house on the bed to collect his vomitus.  His vital signs are normal except for hypertension.  The abdomen is soft with mild diffuse discomfort but no focal tenderness.  Initial lab work-up is significant for hyperglycemia to around 400 as well as an elevated anion gap.  Overall presentation is most consistent with a viral gastroenteritis or foodborne illness, with likely component of DKA or symptoms related to hyperglycemia.  Because of the elevated anion gap, will put the patient on an insulin drip, give a fluid bolus, and reassess.  I anticipate admission.  ----------------------------------------- 4:13 PM on 03/22/2020 -----------------------------------------  VBG shows normal pH.  The patient is receiving insulin.  We will plan for admission.  I discussed his case with the hospitalist Dr. Cathlean Sauer.  ____________________________________________   FINAL CLINICAL IMPRESSION(S) / ED DIAGNOSES  Final diagnoses:  Nausea vomiting and diarrhea  Hyperglycemia      NEW MEDICATIONS STARTED DURING THIS VISIT:  New Prescriptions   No medications on file     Note:  This document was prepared using Dragon voice recognition software and may include unintentional dictation errors.    Arta Silence, MD 03/22/20 [phone]

## 2020-03-23 ENCOUNTER — Observation Stay: Payer: Medicare Other

## 2020-03-23 DIAGNOSIS — G4730 Sleep apnea, unspecified: Secondary | ICD-10-CM | POA: Diagnosis present

## 2020-03-23 DIAGNOSIS — Z20822 Contact with and (suspected) exposure to covid-19: Secondary | ICD-10-CM | POA: Diagnosis present

## 2020-03-23 DIAGNOSIS — Z8673 Personal history of transient ischemic attack (TIA), and cerebral infarction without residual deficits: Secondary | ICD-10-CM | POA: Diagnosis not present

## 2020-03-23 DIAGNOSIS — E1110 Type 2 diabetes mellitus with ketoacidosis without coma: Secondary | ICD-10-CM | POA: Diagnosis present

## 2020-03-23 DIAGNOSIS — E876 Hypokalemia: Secondary | ICD-10-CM | POA: Diagnosis present

## 2020-03-23 DIAGNOSIS — E860 Dehydration: Secondary | ICD-10-CM | POA: Diagnosis present

## 2020-03-23 DIAGNOSIS — Z794 Long term (current) use of insulin: Secondary | ICD-10-CM | POA: Diagnosis not present

## 2020-03-23 DIAGNOSIS — I480 Paroxysmal atrial fibrillation: Secondary | ICD-10-CM | POA: Diagnosis present

## 2020-03-23 DIAGNOSIS — I482 Chronic atrial fibrillation, unspecified: Secondary | ICD-10-CM | POA: Diagnosis present

## 2020-03-23 DIAGNOSIS — K219 Gastro-esophageal reflux disease without esophagitis: Secondary | ICD-10-CM | POA: Diagnosis present

## 2020-03-23 DIAGNOSIS — E1122 Type 2 diabetes mellitus with diabetic chronic kidney disease: Secondary | ICD-10-CM | POA: Diagnosis present

## 2020-03-23 DIAGNOSIS — E861 Hypovolemia: Secondary | ICD-10-CM | POA: Diagnosis present

## 2020-03-23 DIAGNOSIS — N179 Acute kidney failure, unspecified: Secondary | ICD-10-CM | POA: Diagnosis present

## 2020-03-23 DIAGNOSIS — I10 Essential (primary) hypertension: Secondary | ICD-10-CM | POA: Diagnosis not present

## 2020-03-23 DIAGNOSIS — Z7901 Long term (current) use of anticoagulants: Secondary | ICD-10-CM | POA: Diagnosis not present

## 2020-03-23 DIAGNOSIS — Z96652 Presence of left artificial knee joint: Secondary | ICD-10-CM | POA: Diagnosis present

## 2020-03-23 DIAGNOSIS — E785 Hyperlipidemia, unspecified: Secondary | ICD-10-CM | POA: Diagnosis present

## 2020-03-23 DIAGNOSIS — K7469 Other cirrhosis of liver: Secondary | ICD-10-CM | POA: Diagnosis present

## 2020-03-23 DIAGNOSIS — N189 Chronic kidney disease, unspecified: Secondary | ICD-10-CM | POA: Diagnosis present

## 2020-03-23 DIAGNOSIS — D509 Iron deficiency anemia, unspecified: Secondary | ICD-10-CM | POA: Diagnosis present

## 2020-03-23 DIAGNOSIS — R29810 Facial weakness: Secondary | ICD-10-CM | POA: Diagnosis present

## 2020-03-23 DIAGNOSIS — E1310 Other specified diabetes mellitus with ketoacidosis without coma: Secondary | ICD-10-CM | POA: Diagnosis not present

## 2020-03-23 DIAGNOSIS — F329 Major depressive disorder, single episode, unspecified: Secondary | ICD-10-CM | POA: Diagnosis present

## 2020-03-23 DIAGNOSIS — R112 Nausea with vomiting, unspecified: Secondary | ICD-10-CM | POA: Diagnosis present

## 2020-03-23 DIAGNOSIS — N400 Enlarged prostate without lower urinary tract symptoms: Secondary | ICD-10-CM | POA: Diagnosis present

## 2020-03-23 DIAGNOSIS — G4752 REM sleep behavior disorder: Secondary | ICD-10-CM | POA: Diagnosis present

## 2020-03-23 DIAGNOSIS — I129 Hypertensive chronic kidney disease with stage 1 through stage 4 chronic kidney disease, or unspecified chronic kidney disease: Secondary | ICD-10-CM | POA: Diagnosis present

## 2020-03-23 LAB — BASIC METABOLIC PANEL
Anion gap: 8 (ref 5–15)
Anion gap: 12 (ref 5–15)
BUN: 17 mg/dL (ref 8–23)
BUN: 18 mg/dL (ref 8–23)
CO2: 28 mmol/L (ref 22–32)
CO2: 24 mmol/L (ref 22–32)
Calcium: 8.6 mg/dL — ABNORMAL LOW (ref 8.9–10.3)
Calcium: 8.5 mg/dL — ABNORMAL LOW (ref 8.9–10.3)
Chloride: 103 mmol/L (ref 98–111)
Chloride: 104 mmol/L (ref 98–111)
Creatinine, Ser: 0.98 mg/dL (ref 0.61–1.24)
Creatinine, Ser: 1.28 mg/dL — ABNORMAL HIGH (ref 0.61–1.24)
GFR calc Af Amer: >60 mL/min (ref >60)
GFR calc Af Amer: >60 mL/min (ref >60)
GFR calc non Af Amer: >60 mL/min (ref >60)
GFR calc non Af Amer: 54 mL/min — ABNORMAL LOW (ref >60)
Glucose, Bld: 150 mg/dL — ABNORMAL HIGH (ref 70–99)
Glucose, Bld: 177 mg/dL — ABNORMAL HIGH (ref 70–99)
Potassium: 4.3 mmol/L (ref 3.5–5.1)
Potassium: 4 mmol/L (ref 3.5–5.1)
Sodium: 139 mmol/L (ref 135–145)
Sodium: 140 mmol/L (ref 135–145)

## 2020-03-23 LAB — GLUCOSE, CAPILLARY
Glucose-Capillary: 120 mg/dL — ABNORMAL HIGH (ref 70–99)
Glucose-Capillary: 133 mg/dL — ABNORMAL HIGH (ref 70–99)
Glucose-Capillary: 145 mg/dL — ABNORMAL HIGH (ref 70–99)
Glucose-Capillary: 155 mg/dL — ABNORMAL HIGH (ref 70–99)
Glucose-Capillary: 165 mg/dL — ABNORMAL HIGH (ref 70–99)
Glucose-Capillary: 166 mg/dL — ABNORMAL HIGH (ref 70–99)
Glucose-Capillary: 245 mg/dL — ABNORMAL HIGH (ref 70–99)
Glucose-Capillary: 290 mg/dL — ABNORMAL HIGH (ref 70–99)
Glucose-Capillary: 294 mg/dL — ABNORMAL HIGH (ref 70–99)

## 2020-03-23 LAB — MRSA PCR SCREENING: MRSA by PCR: NEGATIVE

## 2020-03-23 LAB — MAGNESIUM: Magnesium: 1.5 mg/dL — ABNORMAL LOW (ref 1.7–2.4)

## 2020-03-23 MED ORDER — GABAPENTIN 300 MG PO CAPS
600.0000 mg | ORAL_CAPSULE | Freq: Three times a day (TID) | ORAL | Status: DC
  Administered 2020-03-23 – 2020-03-24 (×3): 600 mg via ORAL
  Filled 2020-03-23 (×3): qty 2

## 2020-03-23 MED ORDER — LACTATED RINGERS IV SOLN: INTRAVENOUS | Status: DC

## 2020-03-23 MED ORDER — INSULIN ASPART 100 UNIT/ML ~~LOC~~ SOLN
3.0000 [IU] | Freq: Three times a day (TID) | SUBCUTANEOUS | Status: DC
  Administered 2020-03-23 – 2020-03-24 (×4): 3 [IU] via SUBCUTANEOUS
  Filled 2020-03-23 (×4): qty 1

## 2020-03-23 MED ORDER — INSULIN GLARGINE 100 UNIT/ML ~~LOC~~ SOLN
10.0000 [IU] | Freq: Two times a day (BID) | SUBCUTANEOUS | Status: DC
  Administered 2020-03-23 – 2020-03-24 (×3): 10 [IU] via SUBCUTANEOUS
  Filled 2020-03-23 (×6): qty 0.1

## 2020-03-23 MED ORDER — INSULIN ASPART 100 UNIT/ML ~~LOC~~ SOLN
0.0000 [IU] | Freq: Every day | SUBCUTANEOUS | Status: DC
  Administered 2020-03-23: 22:00:00 3 [IU] via SUBCUTANEOUS
  Filled 2020-03-23: qty 1

## 2020-03-23 MED ORDER — SODIUM CHLORIDE 0.9 % IV SOLN: INTRAVENOUS | Status: DC

## 2020-03-23 MED ORDER — INSULIN GLARGINE 100 UNIT/ML ~~LOC~~ SOLN
10.0000 [IU] | Freq: Every day | SUBCUTANEOUS | Status: DC
  Administered 2020-03-23: 10 [IU] via SUBCUTANEOUS
  Filled 2020-03-23 (×2): qty 0.1

## 2020-03-23 MED ORDER — INSULIN ASPART 100 UNIT/ML ~~LOC~~ SOLN
0.0000 [IU] | Freq: Three times a day (TID) | SUBCUTANEOUS | Status: DC
  Administered 2020-03-23: 17:00:00 3 [IU] via SUBCUTANEOUS
  Administered 2020-03-23: 13:00:00 5 [IU] via SUBCUTANEOUS
  Administered 2020-03-23: 2 [IU] via SUBCUTANEOUS
  Administered 2020-03-24 (×2): 3 [IU] via SUBCUTANEOUS
  Filled 2020-03-23 (×5): qty 1

## 2020-03-23 MED ORDER — CHLORHEXIDINE GLUCONATE CLOTH 2 % EX PADS
6.0000 | MEDICATED_PAD | Freq: Every day | CUTANEOUS | Status: DC
  Administered 2020-03-24: 6 via TOPICAL

## 2020-03-23 NOTE — Progress Notes (Addendum)
Called in room to assess pt at 715. Pt confused and combative. Assessed for signs of stroke. Pt has less dexterity on R side, but no loss of strength. Pt also has R side facial droop. Previous nurse stated last known normal at [place]. Called neurology and contacted attending at [phone]. Head CT ordered and left for head CT at 0723. Waiting on response whether to call it a code stroke or not from attending.  Dr. Cathlean Sauer, attending, en route to hospital at time of incident.

## 2020-03-23 NOTE — Progress Notes (Signed)
Physical Therapy Evaluation Patient Details Name: Bernard Pennington MRN: [id number] DOB: October 26, 1945 Today's Date: 03/23/2020   History of Present Illness  Per MD note:Patient admitted to the hospital with the working diagnosis of hyperglycemia with imminent ketoacidosis.   Clinical Impression  Patient agrees to PT evaluation. He has 4/5 strength BLE. 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 He is independent with bed mobility, transfers and ambulation 300 feet without AD. He has good sitting and standing balance and no reports of pain. He has no deficits and will not need skilled PT at this time.     Follow Up Recommendations No PT follow up    Equipment Recommendations  None recommended by PT    Recommendations for Other Services       Precautions / Restrictions Precautions Precautions: None Restrictions Weight Bearing Restrictions: No      Mobility  Bed Mobility Overal bed mobility: Independent                Transfers Overall transfer level: Independent Equipment used: None                Ambulation/Gait Ambulation/Gait assistance: Nurse, learning disability (Feet): 300 Feet Assistive device: None Gait Pattern/deviations: Step-through pattern        Stairs            Wheelchair Mobility    Modified Rankin (Stroke Patients Only)       Balance Overall balance assessment: Independent                                           Pertinent Vitals/Pain Pain Assessment: No/denies pain    Home Living Family/patient expects to be discharged to:: Private residence Living Arrangements: Spouse/significant other Available Help at Discharge: Family Type of Home: House Home Access: Stairs to enter Entrance Stairs-Rails: Right Entrance Stairs-Number of Steps: 2 Home Layout: One level        Prior Function Level of Independence: Independent               Hand Dominance   Dominant Hand: Right     Extremity/Trunk Assessment   Upper Extremity Assessment Upper Extremity Assessment: Defer to OT evaluation    Lower Extremity Assessment Lower Extremity Assessment: Overall WFL for tasks assessed;RLE deficits/detail;LLE deficits/detail RLE Deficits / Details:  (4/5 hip flex, knee ext, ankle DF) RLE Sensation: WNL RLE Coordination: WNL LLE Deficits / Details:  (4/5 hip flex, knee ext, ankle DF) LLE:  (4/5 hip flex, knee ext, ankle DF) LLE Sensation: WNL LLE Coordination: WNL       Communication   Communication: No difficulties  Cognition Arousal/Alertness: Awake/alert Behavior During Therapy: WFL for tasks assessed/performed Overall Cognitive Status: Within Functional Limits for tasks assessed                                        General Comments      Exercises     Assessment/Plan    PT Assessment Patient needs continued PT services  PT Problem List Decreased strength       PT Treatment Interventions      PT Goals (Current goals can be found in the Care Plan section)  Acute Rehab PT Goals PT Goal Formulation: All assessment and education complete, DC therapy    Frequency Other (  Comment)   Barriers to discharge        Co-evaluation               AM-PAC PT "6 Clicks" Mobility  Outcome Measure Help needed turning from your back to your side while in a flat bed without using bedrails?: None Help needed moving from lying on your back to sitting on the side of a flat bed without using bedrails?: None Help needed moving to and from a bed to a chair (including a wheelchair)?: None Help needed standing up from a chair using your arms (e.g., wheelchair or bedside chair)?: None Help needed to walk in hospital room?: None Help needed climbing 3-5 steps with a railing? : None 6 Click Score: 24    End of Session Equipment Utilized During Treatment: Gait belt Activity Tolerance: Patient tolerated treatment well Patient left: in bed;with bed  alarm set Nurse Communication: Mobility status PT Visit Diagnosis: Difficulty in walking, not elsewhere classified (R26.2)    Time: 1110-1130 PT Time Calculation (min) (ACUTE ONLY): 20 min   Charges:   PT Evaluation $PT Eval Low Complexity: 1 Low            [place], PT DPT 03/23/2020, 12:31 PM

## 2020-03-23 NOTE — Plan of Care (Signed)
Pt transferred today from the ICU.  VSS. CBG in the 200's prior to meals.  Novolog given.  Lantus initiated.  Good appetite.  Had loose stools once.

## 2020-03-23 NOTE — Progress Notes (Signed)
PROGRESS NOTE    Bernard Pennington  RWE:[id number] DOB: 11/01/1945 DOA: 03/22/2020 PCP: Valera Castle, MD    Brief Narrative:  Patient admitted to the hospital with the working diagnosis of hyperglycemia with imminent ketoacidosis.   74 year old male with multiple medical problems, he ran out of his long-acting insulin about 4 days ago, and unable to measure his capillary glucose for the last 2 days, consequently he has not been using any short acting insulin for the last 48 hours.  His symptoms have been worsening, associated with nausea, vomiting abdominal pain.  Unable to take p.o. intake.  On his initial physical examination blood pressure 165/96, heart rate 87, respiratory rate 14, oxygen saturation 95%.  He had dry mucous membranes, mild pallor, lungs clear to auscultation bilaterally, heart S1-S2, present rhythmic, abdomen soft, no lower extremity edema. Sodium 136, potassium 4.4, chloride 95, bicarb 24, glucose 410, BUN 18, creatinine 1.34, anion gap 17, white count 7.8, hemoglobin 16.8, hematocrit 46.7, platelet 123. Venous pH 7.39.  SARS COVID-19 negative.  Urinalysis glucose > 500, specific gravity 1.024, 6-10 white cells.  Patient was placed on IV insulin with improvement in his serum glucose and ketocis.   This am had a acute neurologic change with right facial droop, right hand decrease dexterity and confusion. Capillary glucose was 166.   Symptoms brief in nature, occur at 7:15 in am and were self resolved  Assessment & Plan:   Principal Problem:   DKA (diabetic ketoacidoses) ([place]) Active Problems:   Atrial fibrillation, chronic (HCC)   Dyslipidemia   Essential hypertension   GERD (gastroesophageal reflux disease)   Other cirrhosis of liver ([place])  1.  Imminent diabetes ketoacidosis.  Resolved ketocis, anion gap is 12 this am, glucose 177. Received 10 units of insulin glargine last night.   Will change his diet to regular diabetic prudent, continue glucose  cover and monitoring with insulin sliding scale, and will increase basal insulin to 10 units bid. At home uses 26 units q am and 20 units q pm long acting insulin. 3 units aspart with meals.   Will need social services to arrange home insulin.   2.  Hypovolemic acute kidney injury with hypokalemia.  Worsening renal function this am with serum cr ar 1,28 with K at 4.0 and serum bicarbonate at 24.   Will continue hydration with balanced electrolyte solutions 50 ml per H and will follow renal panel in am. Avoid hypotension and nephrotoxic medications.   3.  Hypertension.  Blood pressure 124/74 and 142/80, will continue clonidine per his home regimen.  4.  Chronic paroxysmal atrial fibrillation. Continue with rate control, HR 67 to 71, Ok to discontinue telemetry. Continue anticoagulation with apixaban.   5.  Depression.  On mirtazapine.  6.  GERD. On  pantoprazole.  7. New acute focal neuro deficit. No self resolved, very brief in duration. CT with no acute intracranial changes. Continue with aspirin.   Will continue neuro checks for the next 4 H.   Out of bed to chair, ambulate in the hallway. PT and OT evaluation.   Patient continue to be at high risk for worsening renal failure and recurrent focal neurologic deficit.   Status is: Observation  The patient will require care spanning > 2 midnights and should be moved to inpatient because: IV treatments appropriate due to intensity of illness or inability to take PO. Patient will need continue neurologic monitoring and IV fluids for AKI.   Dispo: The patient is from: Home  Anticipated d/c is to: Home              Anticipated d/c date is: 1 day              Patient currently is not medically stable to d/c.   DVT prophylaxis: apixaban   Code Status:   full  Family Communication:  No family at the bedside     Subjective: Patient is awake and alert, no nausea or vomiting, no dyspnea or chest pain. This am he was  noticed to be confused and combative, decreased right hand function and right facial droop.   Objective: Vitals:   03/23/20 0300 03/23/20 0400 03/23/20 0500 03/23/20 0600  BP: 106/68 125/74 111/76 99/70  Pulse: 60 63 63 61  Resp: 17 17 17 18   Temp: 98.2 F (36.8 C)     TempSrc: Oral     SpO2: 98% 96% 97% 93%  Weight:      Height:        Intake/Output Summary (Last 24 hours) at 03/23/2020 0817 Last data filed at 03/23/2020 6578 Gross per 24 hour  Intake 543.14 ml  Output 200 ml  Net 343.14 ml   Filed Weights   03/22/20 1254 03/22/20 1841  Weight: 95.3 kg 89.1 kg    Examination:   General: deconditioned  Neurology: Awake and alert, non focal. No facial droop, strength is 5/5 all extremities,  E ENT: no pallor, no icterus, oral mucosa moist Cardiovascular: No JVD. S1-S2 present, rhythmic, no gallops, rubs, or murmurs. No lower extremity edema. Pulmonary: positive breath sounds bilaterally, adequate air movement, no wheezing, rhonchi or rales. Gastrointestinal. Abdomen with, no organomegaly, non tender, no rebound or guarding Skin. No rashes Musculoskeletal: no joint deformities     Data Reviewed: I have personally reviewed following labs and imaging studies  CBC: Recent Labs  Lab 03/22/20 1302  WBC 7.8  HGB 16.8  HCT 46.7  MCV 92.3  PLT 469*   Basic Metabolic Panel: Recent Labs  Lab 03/22/20 1302 03/22/20 1713 03/22/20 2038 03/23/20 0054 03/23/20 0620  NA 136 133* 138 139 140  K 4.4 3.6 3.7 4.3 4.0  CL 95* 99 101 103 104  CO2 24 23 28 28 24   GLUCOSE 410* 456* 181* 150* 177*  BUN 18 16 14 17 18   CREATININE 1.34* 1.17 1.04 0.98 1.28*  CALCIUM 9.4 8.4* 8.8* 8.6* 8.5*  MG  --   --   --   --  1.5*   GFR: Estimated Creatinine Clearance (by C-G formula based on SCr of 1.28 mg/dL (H)) Male: 47.7 mL/min (A) Male: 54.7 mL/min (A) Liver Function Tests: Recent Labs  Lab 03/22/20 1302  AST 37  ALT 40  ALKPHOS 143*  BILITOT 1.7*  PROT 7.7  ALBUMIN  4.3   Recent Labs  Lab 03/22/20 1302  LIPASE 24   Recent Labs  Lab 03/22/20 1303  AMMONIA 20   Coagulation Profile: No results for input(s): INR, PROTIME in the last 168 hours. Cardiac Enzymes: No results for input(s): CKTOTAL, CKMB, CKMBINDEX, TROPONINI in the last 168 hours. BNP (last 3 results) No results for input(s): PROBNP in the last 8760 hours. HbA1C: No results for input(s): HGBA1C in the last 72 hours. CBG: Recent Labs  Lab 03/23/20 0038 03/23/20 0139 03/23/20 0226 03/23/20 0429 03/23/20 0740  GLUCAP 120* 145* 133* 166* 155*   Lipid Profile: No results for input(s): CHOL, HDL, LDLCALC, TRIG, CHOLHDL, LDLDIRECT in the last 72 hours. Thyroid Function Tests: No results  for input(s): TSH, T4TOTAL, FREET4, T3FREE, THYROIDAB in the last 72 hours. Anemia Panel: No results for input(s): VITAMINB12, FOLATE, FERRITIN, TIBC, IRON, RETICCTPCT in the last 72 hours.    Radiology Studies: I have reviewed all of the imaging during this hospital visit personally     Scheduled Meds: . apixaban  5 mg Oral Q12H  . vitamin C  1,000 mg Oral TID  . aspirin EC  81 mg Oral Daily  . Chlorhexidine Gluconate Cloth  6 each Topical Daily  . cloNIDine  0.1 mg Oral BID  . famotidine  20 mg Oral Daily  . gabapentin  900 mg Oral TID  . insulin aspart  0-5 Units Subcutaneous QHS  . insulin aspart  0-9 Units Subcutaneous TID WC  . insulin aspart  3 Units Subcutaneous TID WC  . insulin glargine  10 Units Subcutaneous QHS  . magnesium oxide  400 mg Oral BID  . melatonin  5 mg Oral QHS  . mirtazapine  15 mg Oral QHS  . multivitamin with minerals  1 tablet Oral Daily  . niacin  1,000 mg Oral BID  . omega-3 acid ethyl esters  1 g Oral Daily  . pantoprazole  40 mg Oral Daily  . sodium chloride flush  3 mL Intravenous Once  . sotalol  80 mg Oral Daily  . tamsulosin  0.4 mg Oral Daily   Continuous Infusions: . sodium chloride Stopped (03/22/20 1713)  . sodium chloride 75 mL/hr at  03/23/20 0611  . dextrose 5 % and 0.45% NaCl 75 mL/hr at 03/22/20 1712     LOS: 0 days        Felesia Stahlecker Gerome Apley, MD  \

## 2020-03-23 NOTE — Evaluation (Signed)
Occupational Therapy Evaluation Patient Details Name: Bernard Pennington MRN: [id number] DOB: 1946-01-16 Today's Date: 03/23/2020    History of Present Illness 74 year old male with multiple medical problems, he ran out of his long-acting insulin about 4 days ago, and unable to measure his capillary glucose for the last 2 days, consequently he has not been using any short acting insulin for the last 48 hours.  His symptoms have been worsening, associated with nausea, vomiting abdominal pain.  Unable to take p.o. intake.  Patient admitted to the hospital with the working diagnosis of hyperglycemia with imminent ketoacidosis.    Clinical Impression   Bernard Pennington was seen for OT evaluation this date. Prior to hospital admission, pt was Independent in I/ADLs including driving. Pt lives c wife in [facility] with newly renovated bathroom. Pt is A&O x4 reporting that today is his 50th wedding anniversary. Currently pt reporting symptoms have resolved. Pt demonstrates baseline independence to perform ADL and mobility tasks and no strength, coordination, cognitive, or visual deficits appreciated with assessment. No skilled OT needs identified. Will sign off. Please re-consult if additional OT needs arise.      Follow Up Recommendations  No OT follow up    Equipment Recommendations  Tub/shower seat    Recommendations for Other Services       Precautions / Restrictions Precautions Precautions: None Restrictions Weight Bearing Restrictions: No      Mobility Bed Mobility Overal bed mobility: Independent                Transfers Overall transfer level: Independent Equipment used: None                  Balance Overall balance assessment: Modified Independent                                         ADL either performed or assessed with clinical judgement   ADL Overall ADL's : Modified independent                                       General  ADL Comments: MOD I for sitting LBD and bathing. MOD I for intermittent single UE support standing sinkside ~5 mins tooth brushing. Pt and wife report plan to buy shower seat for newly renovated walk in shower.     Vision Baseline Vision/History: Wears glasses Wears Glasses: Reading only       Perception     Praxis      Pertinent Vitals/Pain Pain Assessment: No/denies pain     Hand Dominance Right   Extremity/Trunk Assessment Upper Extremity Assessment Upper Extremity Assessment: Overall WFL for tasks assessed ([facility] WFL)   Lower Extremity Assessment Lower Extremity Assessment: Overall WFL for tasks assessed RLE Deficits / Details:  (4/5 hip flex, knee ext, ankle DF) RLE Sensation: WNL RLE Coordination: WNL LLE Deficits / Details:  (4/5 hip flex, knee ext, ankle DF) LLE:  (4/5 hip flex, knee ext, ankle DF) LLE Sensation: WNL LLE Coordination: WNL       Communication Communication Communication: No difficulties   Cognition Arousal/Alertness: Awake/alert Behavior During Therapy: WFL for tasks assessed/performed Overall Cognitive Status: Within Functional Limits for tasks assessed  General Comments: Pt A&O x4   General Comments       Exercises Exercises: Other exercises Other Exercises Other Exercises: Pt and caregiver educated re: OT role, DME recs, d/c recs, falls prevention, ECS Other Exercises: LBD, tooth brushing, sitting/standing balance/tolerance, sup<>sit, sit<>stand, in room ~15 ft mobility   Shoulder Instructions      Home Living Family/patient expects to be discharged to:: Private residence Living Arrangements: Spouse/significant other Available Help at Discharge: Family;Available 24 hours/day Type of Home: House Home Access: Stairs to enter [employer] of Steps: 2 Entrance Stairs-Rails: Right Home Layout: One level     Bathroom Shower/Tub: Occupational psychologist: Standard      Home Equipment: Hand held shower head;Grab bars - tub/shower (plan for shower seat and toilet grab bars)          Prior Functioning/Environment Level of Independence: Independent                 OT Problem List: Decreased activity tolerance;Decreased knowledge of use of DME or AE      OT Treatment/Interventions:      OT Goals(Current goals can be found in the care plan section) Acute Rehab OT Goals Patient Stated Goal: To return home  OT Goal Formulation: With patient/family Time For Goal Achievement: 04/06/20 Potential to Achieve Goals: Good  OT Frequency:     Barriers to D/C:            Co-evaluation              AM-PAC OT "6 Clicks" Daily Activity     Outcome Measure Help from another person eating meals?: None Help from another person taking care of personal grooming?: None Help from another person toileting, which includes using toliet, bedpan, or urinal?: None Help from another person bathing (including washing, rinsing, drying)?: A Little Help from another person to put on and taking off regular upper body clothing?: None Help from another person to put on and taking off regular lower body clothing?: None 6 Click Score: 23   End of Session    Activity Tolerance: Patient tolerated treatment well Patient left: in bed;with bed alarm set;with family/visitor present  OT Visit Diagnosis: Other abnormalities of gait and mobility (R26.89)                Time: 6270-3500 OT Time Calculation (min): 18 min Charges:  OT General Charges $OT Visit: 1 Visit OT Evaluation $OT Eval Low Complexity: 1 Low OT Treatments $Self Care/Home Management : 8-22 mins  Dessie Coma, M.S. OTR/L  03/23/20, 12:58 PM

## 2020-03-24 DIAGNOSIS — N179 Acute kidney failure, unspecified: Secondary | ICD-10-CM

## 2020-03-24 DIAGNOSIS — E1140 Type 2 diabetes mellitus with diabetic neuropathy, unspecified: Secondary | ICD-10-CM

## 2020-03-24 DIAGNOSIS — E1310 Other specified diabetes mellitus with ketoacidosis without coma: Secondary | ICD-10-CM

## 2020-03-24 DIAGNOSIS — E119 Type 2 diabetes mellitus without complications: Secondary | ICD-10-CM

## 2020-03-24 DIAGNOSIS — Z794 Long term (current) use of insulin: Secondary | ICD-10-CM

## 2020-03-24 LAB — BASIC METABOLIC PANEL
Anion gap: 10 (ref 5–15)
BUN: 16 mg/dL (ref 8–23)
CO2: 26 mmol/L (ref 22–32)
Calcium: 8.6 mg/dL — ABNORMAL LOW (ref 8.9–10.3)
Chloride: 103 mmol/L (ref 98–111)
Creatinine, Ser: 1.02 mg/dL (ref 0.61–1.24)
GFR calc Af Amer: >60 mL/min (ref >60)
GFR calc non Af Amer: >60 mL/min (ref >60)
Glucose, Bld: 213 mg/dL — ABNORMAL HIGH (ref 70–99)
Potassium: 4.1 mmol/L (ref 3.5–5.1)
Sodium: 139 mmol/L (ref 135–145)

## 2020-03-24 LAB — GLUCOSE, CAPILLARY
Glucose-Capillary: 170 mg/dL — ABNORMAL HIGH (ref 70–99)
Glucose-Capillary: 201 mg/dL — ABNORMAL HIGH (ref 70–99)
Glucose-Capillary: 245 mg/dL — ABNORMAL HIGH (ref 70–99)

## 2020-03-24 MED ORDER — BLOOD GLUCOSE MONITOR KIT: PACK | 0 refills | Status: AC

## 2020-03-24 MED ORDER — INSULIN ASPART 100 UNIT/ML ~~LOC~~ SOLN: 10.0000 [IU] | SUBCUTANEOUS | 0 refills | Status: AC

## 2020-03-24 NOTE — Progress Notes (Signed)
Subjective: Notified by primary RN that patient had unwitnessed fall without any obvious injury. Per RN, patient had a dream in which he state that someone was chasing him. He was noted to be  Confused, combative with garbled speech which resolved quickly.  Objective:On exam, he was afebrile with blood pressure 171/83 mm Hg and pulse rate 60 beats/min. There were no focal neurological deficits; he was alert and oriented x3, and he did not demonstrate any memory deficits.   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 Consider repeat CT head if change in exam - Falls precaution - Discontinue Melatonin as it has been associated with REM sleep behavior disorder    Rufina Falco, BSN, DNP, CCRN, FNP-BC [facility]

## 2020-03-24 NOTE — Progress Notes (Signed)
Patient is discharged home accompanied by his wife.   Discharge paper is given to patient and instructions.  Meds and f/u appointments reviewed. Rx given .Patient verbalized understanding.

## 2020-03-24 NOTE — Progress Notes (Signed)
Bp 175/93 text sent to  dr Cathlean Sauer for orders.

## 2020-03-24 NOTE — Progress Notes (Addendum)
Patient alarm went off, ran to patient room and patient on the floor, patient confused, combative and with slurred speech. Confusing lasted for about one minute and patient was back to baseline. Patient states he had a bad dream about someone after him. Patient also stated that it happen to him last night in CCU. No injury noted, hospitalist notified. Called wife, no answer, left message to return call. Will continue to monitor.

## 2020-03-24 NOTE — Discharge Summary (Signed)
Physician Discharge Summary  Bernard Pennington VWU:[id number] DOB: [birth year] DOA: 03/22/2020  PCP: Valera Castle, MD  Admit date: 03/22/2020 Discharge date: 03/24/2020  Admitted From: Home  Disposition:  Home   Recommendations for Outpatient Follow-up and new medication changes:  1. Follow up with Dr. Kym Groom in 7 days.  2. New prescription for testing strips  3. Refilled insulin aspart pen.  4. Continue glucose monitoring   Home Health: no  Equipment/Devices: no   Discharge Condition: stable  CODE STATUS: full  Diet recommendation: heart healthy and diabetic prudent.   Brief/Interim Summary: Patient admitted to the hospital with the working diagnosis of hyperglycemia with ketosis, imminent acidosis.    74 year old male with multiple medical problems, he ran out of his long-acting insulin about 4 days ago, and unable to measure his capillary glucose for the last 2 days, consequently he has not been using any short acting insulin for about 48 hours. His symptoms have been worsening, associated with nausea, vomiting abdominal pain. Unable to take p.o. intake. On his initial physical examination blood pressure 165/96, heart rate 87, respiratory rate14, oxygen saturation 95%. He had dry mucous membranes, mild pallor, lungs clear to auscultation bilaterally, heart S1-S2, present rhythmic, abdomen soft, no lower extremity edema. Sodium 136, potassium 4.4, chloride 95, bicarb 24, glucose 410, BUN 18, creatinine 1.34, anion gap 17, white count 7.8, hemoglobin 16.8, hematocrit 46.7, platelet 123. Venous pH 7.39. SARS COVID-19 negative. Urinalysis glucose >500, specific gravity 1.024,6-10 white cells.  Patient was placed on IV insulin with improvement in his serum glucose and ketocis.   Patient had an acute neurologic change with right facial droop, right hand decrease dexterity and confusion. Capillary glucose was 166.  Symptoms brief in nature, occur at 7:15 in am and were  self resolved, head CT negative, and no further neurologic changes, patient ruled out for CVA/ TIA.   1.  Uncontrolled type II diabetes mellitus with hyperglycemia, ketosis and imminent acidosis.  Patient was admitted to the stepdown unit, he was treated with intravenous insulin with improvement of his anion gap along with his symptoms.  Patient's diet was advanced and he was successfully transitioned to subcutaneous insulin with good coloration.  At discharge patient will continue his regimen of long-acting insulin combined with short acting formulation.  A prescription for insulin has been provided.  Insulin aspart, he assures that he does have insulin NPH already at home.  His fasting glucose on the day of discharge is 170 to 213.  2.  Hypovolemic acute kidney injury with hypokalemia.  Patient received intravenous isotonic fluids and potassium chloride with improvement of kidney function and electrolytes.  His discharge sodium is 139, potassium 4.1, chloride 103, bicarb 26, glucose 213, BUN 16, creatinine 1.02.  3.  Hypertension.  Patient had clonidine resumed, at discharge she will continue with amlodipine, furosemide and spironolactone per his home regimen.   4.  Chronic paroxysmal atrial fibrillation.  His heart rate remained well controlled, continue anticoagulation with apixaban.  5.  Depression.  Continue mirtazapine.  6.  GERD.  Continue pantoprazole.  7.  Transient focal neurologic deficit.  Patient had a brief episode of right hand decreased dexterity, confusion and right facial droop.  Further work-up CT head negative, event was self resolved and not recurrent.  Clinically CVA/TIA was ruled out.  Symptoms likely metabolic.\  8. Anemia of iron deficiency. Continue with iron supplementation.   9. BPH. Continue with finasteride and tamsulosin.   Discharge Diagnoses:  Principal Problem:   Ketosis  due to diabetes [facility]) Active Problems:   Atrial fibrillation, chronic (HCC)    Dyslipidemia   Essential hypertension   GERD (gastroesophageal reflux disease)   Other cirrhosis of liver (HCC)   AKI (acute kidney injury) ([place])   T2DM (type 2 diabetes mellitus) ([place])    Discharge Instructions   Allergies as of 03/24/2020   No Known Allergies     Medication List    TAKE these medications   amLODipine 5 MG tablet Commonly known as: NORVASC Take 5 mg by mouth daily.   blood glucose meter kit and supplies Kit Dispense based on patient and insurance preference. Use up to four times daily as directed. (FOR ICD-9 250.00, 250.01).   Cinnamon 500 MG capsule Take 500 mg by mouth 3 (three) times daily before meals.   citalopram 20 MG tablet Commonly known as: CELEXA Take 20 mg by mouth daily.   cloNIDine 0.1 MG tablet Commonly known as: CATAPRES Take 0.1 mg by mouth 2 (two) times daily.   Eliquis 2.5 MG Tabs tablet Generic drug: apixaban Take 2.5 mg by mouth 2 (two) times daily.   ferrous sulfate 325 (65 FE) MG tablet Take 325 mg by mouth every other day.   finasteride 5 MG tablet Commonly known as: PROSCAR Take 5 mg by mouth daily.   Fish Oil 1000 MG Caps Take 1,000 mg by mouth 2 (two) times daily.   furosemide 20 MG tablet Commonly known as: LASIX Take 20 mg by mouth daily as needed for fluid or edema.   gabapentin 800 MG tablet Commonly known as: NEURONTIN Take 800 mg by mouth 3 (three) times daily.   insulin aspart 100 UNIT/ML injection Commonly known as: novoLOG Inject 10 Units into the skin See admin instructions. Inject 10u (plus sliding scale) three times daily at mealtimes - max 50u daily   insulin NPH Human 100 UNIT/ML injection Commonly known as: NOVOLIN N Inject 14-18 Units into the skin See admin instructions. Inject 18u under the skin every morning and inject 14u under the skin every night   multivitamin with minerals Tabs tablet Take 1 tablet by mouth daily.   niacin 1000 MG CR tablet Commonly known as: NIASPAN Take 1,000  mg by mouth 2 (two) times daily.   omeprazole 40 MG capsule Commonly known as: PRILOSEC Take 40 mg by mouth daily.   sotalol 80 MG tablet Commonly known as: BETAPACE Take 1 tablet (80 mg total) by mouth daily.   spironolactone 25 MG tablet Commonly known as: ALDACTONE Take 25 mg by mouth daily as needed.   tamsulosin 0.4 MG Caps capsule Commonly known as: FLOMAX Take 0.8 mg by mouth daily.   vitamin C 1000 MG tablet Take 1,000 mg by mouth 3 (three) times daily.       Follow-up Information    Kym Groom Guy Begin, MD Follow up in 1 week(s).   Specialty: Family Medicine Contact information: [place] [address] [phone]              No Known Allergies      Procedures/Studies: CT HEAD WO CONTRAST  Result Date: 03/23/2020 CLINICAL DATA:  Delirium. EXAM: CT HEAD WITHOUT CONTRAST TECHNIQUE: Contiguous axial images were obtained from the base of the skull through the vertex without intravenous contrast. COMPARISON:  December 17, 2017 FINDINGS: Brain: No subdural, epidural, or subarachnoid hemorrhage. Ventricles and sulci are prominent but stable. Cerebellum, brainstem, and basal cisterns are normal. No mass effect or midline shift. No acute cortical  ischemia or infarct. Vascular: Calcified atherosclerosis is seen in the intracranial carotids. Skull: Normal. Negative for fracture or focal lesion. Sinuses/Orbits: No acute finding. Other: None. IMPRESSION: 1. No acute intracranial abnormalities to explain the patient's symptoms. Electronically Signed   By: Dorise Bullion III M.D   On: 03/23/2020 08:30       Subjective: Patient is feeling better, this am he fell from the bed in the setting of a nightmare. At the time of my examination patient is back to baseline, no nausea or vomiting. Tolerating po well.   Discharge Exam: Vitals:   03/24/20 0457 03/24/20 0751  BP: (!) 171/83 (!) 175/93  Pulse: 60 64  Resp: 20 19  Temp: 97.7 F (36.5 C) 98.5 F  (36.9 C)  SpO2: 97% 98%   Vitals:   03/24/20 0141 03/24/20 0313 03/24/20 0457 03/24/20 0751  BP: (!) 141/84 (!) 161/83 (!) 171/83 (!) 175/93  Pulse: 65 (!) 59 60 64  Resp:  16 20 19   Temp:  97.8 F (36.6 C) 97.7 F (36.5 C) 98.5 F (36.9 C)  TempSrc:   Oral Oral  SpO2:  97% 97% 98%  Weight:      Height:        General: Not in pain or dyspnea.  Neurology: Awake and alert, non focal  E ENT: no pallor, no icterus, oral mucosa moist Cardiovascular: No JVD. S1-S2 present, rhythmic, no gallops, rubs, or murmurs. No lower extremity edema. Pulmonary: positive breath sounds bilaterally, adequate air movement, no wheezing, rhonchi or rales. Gastrointestinal. Abdomen with no organomegaly, non tender, no rebound or guarding Skin. No rashes Musculoskeletal: no joint deformities   The results of significant diagnostics from this hospitalization (including imaging, microbiology, ancillary and laboratory) are listed below for reference.     Microbiology: Recent Results (from the past 240 hour(s))  SARS Coronavirus 2 by RT PCR (hospital order, performed in [facility] hospital lab) Nasopharyngeal Nasopharyngeal Swab     Status: None   Collection Time: 03/22/20  3:55 PM   Specimen: Nasopharyngeal Swab  Result Value Ref Range Status   SARS Coronavirus 2 NEGATIVE NEGATIVE Final    Comment: (NOTE) SARS-CoV-2 target nucleic acids are NOT DETECTED.  The SARS-CoV-2 RNA is generally detectable in upper and lower respiratory specimens during the acute phase of infection. The lowest concentration of SARS-CoV-2 viral copies this assay can detect is 250 copies / mL. A negative result does not preclude SARS-CoV-2 infection and should not be used as the sole basis for treatment or other patient management decisions.  A negative result may occur with improper specimen collection / handling, submission of specimen other than nasopharyngeal swab, presence of viral mutation(s) within the areas  targeted by this assay, and inadequate number of viral copies (<250 copies / mL). A negative result must be combined with clinical observations, patient history, and epidemiological information.  Fact Sheet for Patients:   StrictlyIdeas.no  Fact Sheet for Healthcare Providers: BankingDealers.co.za  This test is not yet approved or  cleared by the [place] FDA and has been authorized for detection and/or diagnosis of SARS-CoV-2 by FDA under an Emergency Use Authorization (EUA).  This EUA will remain in effect (meaning this test can be used) for the duration of the COVID-19 declaration under Section 564(b)(1) of the Act, 21 U.S.C. section 360bbb-3(b)(1), unless the authorization is terminated or revoked sooner.  Performed at [facility], [address] [place], [place] [address]   MRSA PCR Screening     Status:  None   Collection Time: 03/23/20  8:50 PM   Specimen: Nasal Mucosa; Nasopharyngeal  Result Value Ref Range Status   MRSA by PCR NEGATIVE NEGATIVE Final    Comment:        The GeneXpert MRSA Assay (FDA approved for NASAL specimens only), is one component of a comprehensive MRSA colonization surveillance program. It is not intended to diagnose MRSA infection nor to guide or monitor treatment for MRSA infections. Performed at [facility], [address] [place], [place] [address]      Labs: BNP (last 3 results) No results for input(s): BNP in the last 8760 hours. Basic Metabolic Panel: Recent Labs  Lab 03/22/20 1713 03/22/20 2038 03/23/20 0054 03/23/20 0620 03/24/20 0535  NA 133* 138 139 140 139  K 3.6 3.7 4.3 4.0 4.1  CL 99 101 103 104 103  CO2 23 28 28 24 26   GLUCOSE 456* 181* 150* 177* 213*  BUN 16 14 17 18 16   CREATININE 1.17 1.04 0.98 1.28* 1.02  CALCIUM 8.4* 8.8* 8.6* 8.5* 8.6*  MG  --   --   --  1.5*  --    Liver Function Tests: Recent Labs  Lab 03/22/20 1302  AST 37   ALT 40  ALKPHOS 143*  BILITOT 1.7*  PROT 7.7  ALBUMIN 4.3   Recent Labs  Lab 03/22/20 1302  LIPASE 24   Recent Labs  Lab 03/22/20 1303  AMMONIA 20   CBC: Recent Labs  Lab 03/22/20 1302  WBC 7.8  HGB 16.8  HCT 46.7  MCV 92.3  PLT 123*   Cardiac Enzymes: No results for input(s): CKTOTAL, CKMB, CKMBINDEX, TROPONINI in the last 168 hours. BNP: Invalid input(s): POCBNP CBG: Recent Labs  Lab 03/23/20 1217 03/23/20 1630 03/23/20 2140 03/24/20 0124 03/24/20 0751  GLUCAP 290* 245* 294* 170* 201*   D-Dimer No results for input(s): DDIMER in the last 72 hours. Hgb A1c No results for input(s): HGBA1C in the last 72 hours. Lipid Profile No results for input(s): CHOL, HDL, LDLCALC, TRIG, CHOLHDL, LDLDIRECT in the last 72 hours. Thyroid function studies No results for input(s): TSH, T4TOTAL, T3FREE, THYROIDAB in the last 72 hours.  Invalid input(s): FREET3 Anemia work up No results for input(s): VITAMINB12, FOLATE, FERRITIN, TIBC, IRON, RETICCTPCT in the last 72 hours. Urinalysis    Component Value Date/Time   COLORURINE YELLOW (A) 03/22/2020 1302   APPEARANCEUR HAZY (A) 03/22/2020 1302   LABSPEC 1.024 03/22/2020 1302   PHURINE 5.0 03/22/2020 1302   GLUCOSEU >=500 (A) 03/22/2020 1302   HGBUR NEGATIVE 03/22/2020 1302   BILIRUBINUR NEGATIVE 03/22/2020 1302   KETONESUR 20 (A) 03/22/2020 1302   PROTEINUR 30 (A) 03/22/2020 1302   NITRITE NEGATIVE 03/22/2020 1302   LEUKOCYTESUR NEGATIVE 03/22/2020 1302   Sepsis Labs Invalid input(s): PROCALCITONIN,  WBC,  LACTICIDVEN Microbiology Recent Results (from the past 240 hour(s))  SARS Coronavirus 2 by RT PCR (hospital order, performed in [place] hospital lab) Nasopharyngeal Nasopharyngeal Swab     Status: None   Collection Time: 03/22/20  3:55 PM   Specimen: Nasopharyngeal Swab  Result Value Ref Range Status   SARS Coronavirus 2 NEGATIVE NEGATIVE Final    Comment: (NOTE) SARS-CoV-2 target nucleic acids are NOT  DETECTED.  The SARS-CoV-2 RNA is generally detectable in upper and lower respiratory specimens during the acute phase of infection. The lowest concentration of SARS-CoV-2 viral copies this assay can detect is 250 copies / mL. A negative result does not preclude SARS-CoV-2 infection  and should not be used as the sole basis for treatment or other patient management decisions.  A negative result may occur with improper specimen collection / handling, submission of specimen other than nasopharyngeal swab, presence of viral mutation(s) within the areas targeted by this assay, and inadequate number of viral copies (<250 copies / mL). A negative result must be combined with clinical observations, patient history, and epidemiological information.  Fact Sheet for Patients:   StrictlyIdeas.no  Fact Sheet for Healthcare Providers: BankingDealers.co.za  This test is not yet approved or  cleared by the [place] FDA and has been authorized for detection and/or diagnosis of SARS-CoV-2 by FDA under an Emergency Use Authorization (EUA).  This EUA will remain in effect (meaning this test can be used) for the duration of the COVID-19 declaration under Section 564(b)(1) of the Act, 21 U.S.C. section 360bbb-3(b)(1), unless the authorization is terminated or revoked sooner.  Performed at [facility], [address] [place], [place] [address]   MRSA PCR Screening     Status: None   Collection Time: 03/23/20  8:50 PM   Specimen: Nasal Mucosa; Nasopharyngeal  Result Value Ref Range Status   MRSA by PCR NEGATIVE NEGATIVE Final    Comment:        The GeneXpert MRSA Assay (FDA approved for NASAL specimens only), is one component of a comprehensive MRSA colonization surveillance program. It is not intended to diagnose MRSA infection nor to guide or monitor treatment for MRSA infections. Performed at [facility], [address], [place], [place] [address]      Time coordinating discharge: 45 minutes  SIGNED:   Tawni Millers, MD  [facility] 03/24/2020, 10:28 AM

## 2020-03-25 LAB — GLUCOSE, CAPILLARY: Glucose-Capillary: 221 mg/dL — ABNORMAL HIGH (ref 70–99)

## 2021-12-09 ENCOUNTER — Encounter: Payer: Self-pay | Admitting: Internal Medicine

## 2021-12-10 ENCOUNTER — Other Ambulatory Visit: Payer: Self-pay

## 2021-12-10 ENCOUNTER — Encounter
Admission: RE | Disposition: A | Discharge status: Home / Self Care | Payer: Self-pay | Source: Home / Self Care | Attending: Internal Medicine

## 2021-12-10 ENCOUNTER — Ambulatory Visit: Payer: Medicare Other | Admitting: Anesthesiology

## 2021-12-10 ENCOUNTER — Ambulatory Visit
Admission: RE | Admit: 2021-12-10 | Discharge: 2021-12-10 | Disposition: A | Discharge status: Home / Self Care | Payer: Medicare Other | Attending: Internal Medicine | Admitting: Internal Medicine

## 2021-12-10 ENCOUNTER — Encounter: Payer: Self-pay | Admitting: Internal Medicine

## 2021-12-10 DIAGNOSIS — Z1211 Encounter for screening for malignant neoplasm of colon: Secondary | ICD-10-CM | POA: Insufficient documentation

## 2021-12-10 DIAGNOSIS — I4891 Unspecified atrial fibrillation: Secondary | ICD-10-CM | POA: Diagnosis not present

## 2021-12-10 DIAGNOSIS — K640 First degree hemorrhoids: Secondary | ICD-10-CM | POA: Diagnosis not present

## 2021-12-10 DIAGNOSIS — K5190 Ulcerative colitis, unspecified, without complications: Secondary | ICD-10-CM | POA: Diagnosis not present

## 2021-12-10 DIAGNOSIS — E119 Type 2 diabetes mellitus without complications: Secondary | ICD-10-CM | POA: Diagnosis not present

## 2021-12-10 DIAGNOSIS — I1310 Hypertensive heart and chronic kidney disease without heart failure, with stage 1 through stage 4 chronic kidney disease, or unspecified chronic kidney disease: Secondary | ICD-10-CM | POA: Insufficient documentation

## 2021-12-10 DIAGNOSIS — N189 Chronic kidney disease, unspecified: Secondary | ICD-10-CM | POA: Insufficient documentation

## 2021-12-10 DIAGNOSIS — Z7901 Long term (current) use of anticoagulants: Secondary | ICD-10-CM | POA: Insufficient documentation

## 2021-12-10 DIAGNOSIS — Z950 Presence of cardiac pacemaker: Secondary | ICD-10-CM | POA: Insufficient documentation

## 2021-12-10 DIAGNOSIS — K635 Polyp of colon: Secondary | ICD-10-CM | POA: Diagnosis not present

## 2021-12-10 DIAGNOSIS — Z86010 Personal history of colonic polyps: Secondary | ICD-10-CM | POA: Diagnosis not present

## 2021-12-10 HISTORY — PX: COLONOSCOPY WITH PROPOFOL: SHX5780

## 2021-12-10 HISTORY — DX: Gastroparesis: K31.84

## 2021-12-10 HISTORY — DX: Polyneuropathy, unspecified: G62.9

## 2021-12-10 HISTORY — DX: Unspecified hearing loss, unspecified ear: H91.90

## 2021-12-10 HISTORY — DX: Presence of cardiac pacemaker: Z95.0

## 2021-12-10 LAB — GLUCOSE, CAPILLARY: Glucose-Capillary: 179 mg/dL — ABNORMAL HIGH (ref 70–99)

## 2021-12-10 SURGERY — COLONOSCOPY WITH PROPOFOL
Anesthesia: General

## 2021-12-10 MED ORDER — PROPOFOL 10 MG/ML IV BOLUS
INTRAVENOUS | Status: DC | PRN
  Administered 2021-12-10 (×2): 10 mg via INTRAVENOUS
  Administered 2021-12-10: 50 mg via INTRAVENOUS

## 2021-12-10 MED ORDER — LIDOCAINE HCL (CARDIAC) PF 100 MG/5ML IV SOSY
PREFILLED_SYRINGE | INTRAVENOUS | Status: DC | PRN
  Administered 2021-12-10: 100 mg via INTRAVENOUS

## 2021-12-10 MED ORDER — PROPOFOL 500 MG/50ML IV EMUL
INTRAVENOUS | Status: DC | PRN
  Administered 2021-12-10: 155 ug/kg/min via INTRAVENOUS

## 2021-12-10 MED ORDER — SODIUM CHLORIDE 0.9 % IV SOLN: INTRAVENOUS | Status: DC

## 2021-12-10 NOTE — Addendum Note (Signed)
Addendum  created 12/10/21 1031 by Kelton Pillar, CRNA  ? Child order released for a procedure order, Clinical Note Signed, Flowsheet accepted, Intraprocedure Blocks edited, SmartForm saved  ?  ?

## 2021-12-10 NOTE — H&P (Signed)
Outpatient short stay form Pre-procedure ?12/10/2021 8:45 AM ?Lorie Apley K. Alice Reichert, M.D. ? ?Primary Physician: Johny Drilling, M.D. ? ?Reason for visit:  Personal history of colon polyps, ulcerative colitis ? ?History of present illness:  Mr. Noxon presents today after long absence from this practice for over 4 years. I did seen the patient last on August 17, 2017 we performed upper and lower luminal evaluation revealing diffuse gastritis as well as adenomatous polyps. Biopsies were obtained for history of ulcerative colitis for surveillance and were all negative for dysplasia. ?Latest colonoscopy performed on 09/03/2020 revealed sessile serrated adenomas as well as tubular adenomas with biopsies and the large intestine negative for dysplasia in the setting of chronic ulcerative colitis in remission. ?The patient would like to schedule his repeat colonoscopy with [place]. He wishes to keep his general and subspecialty care locally in [place] area at this time. He is currently transitioning from cardiology and [facility] to [facility] cardiology here in [place]. He has an appointment with his new cardiologist tomorrow. He takes Eliquis for personal history of atrial fibrillation. He has chronic heart disease and also has a pacemaker without defibrillator placed. ?From a GI standpoint, the patient denies any hematemesis melena or hematochezia. He has no abdominal pain. He has had occasionally firm stools related to chronic constipation when he does not take his daily MiraLAX 17 g. The bleeding is self-limited and resolves with softening of the stool. ?One of his physicians at [facility] "cancer doctor" recommended a colonoscopy now given the serrated adenoma and adenomatous findings of his last colonoscopy.  ? ? ? ?Current Facility-Administered Medications:  ?  0.9 %  sodium chloride infusion, , Intravenous, Continuous, [place], Benay Pike, MD, Last Rate: 20 mL/hr at 12/10/21 0804, New Bag at 12/10/21 0804 ? ?Medications Prior to  Admission  ?Medication Sig Dispense Refill Last Dose  ? amLODipine (NORVASC) 5 MG tablet Take 5 mg by mouth daily.   12/09/2021  ? Ascorbic Acid (VITAMIN C) 1000 MG tablet Take 1,000 mg by mouth 3 (three) times daily.   12/09/2021  ? cetirizine (ZYRTEC) 10 MG tablet Take 10 mg by mouth daily.   12/09/2021  ? Cinnamon 500 MG capsule Take 500 mg by mouth 3 (three) times daily before meals.    12/09/2021  ? citalopram (CELEXA) 20 MG tablet Take 20 mg by mouth daily.   12/10/2021 at 0630  ? cloNIDine (CATAPRES) 0.1 MG tablet Take 0.1 mg by mouth 2 (two) times daily.    12/10/2021 at 0630  ? furosemide (LASIX) 20 MG tablet Take 20 mg by mouth daily as needed for fluid or edema.   Past Month  ? gabapentin (NEURONTIN) 800 MG tablet Take 800 mg by mouth 3 (three) times daily.    12/09/2021  ? insulin aspart (NOVOLOG) 100 UNIT/ML injection Inject 10 Units into the skin See admin instructions. Inject 10u (plus sliding scale) three times daily at mealtimes - max 50u daily 10 mL 0 Past Week  ? insulin NPH Human (HUMULIN N,NOVOLIN N) 100 UNIT/ML injection Inject 14-18 Units into the skin See admin instructions. Inject 18u under the skin every morning and inject 14u under the skin every night   Past Week  ? metFORMIN (GLUCOPHAGE) 500 MG tablet Take by mouth 2 (two) times daily with a meal.   12/09/2021  ? Multiple Vitamin (MULTIVITAMIN WITH MINERALS) TABS tablet Take 1 tablet by mouth daily.   12/09/2021  ? niacin (NIASPAN) 1000 MG CR tablet Take 1,000 mg by mouth 2 (two)  times daily.   12/09/2021  ? Omega-3 Fatty Acids (FISH OIL) 1000 MG CAPS Take 1,000 mg by mouth 2 (two) times daily.    12/09/2021  ? omeprazole (PRILOSEC) 40 MG capsule Take 40 mg by mouth daily.   12/09/2021  ? sotalol (BETAPACE) 80 MG tablet Take 1 tablet (80 mg total) by mouth daily.   12/10/2021 at 0630  ? tamsulosin (FLOMAX) 0.4 MG CAPS capsule Take 0.8 mg by mouth daily.    12/09/2021  ? blood glucose meter kit and supplies KIT Dispense based on patient and insurance  preference. Use up to four times daily as directed. (FOR ICD-9 250.00, 250.01). 1 each 0   ? ELIQUIS 2.5 MG TABS tablet Take 2.5 mg by mouth 2 (two) times daily.   12/06/2021 at 0800  ? ferrous sulfate 325 (65 FE) MG tablet Take 325 mg by mouth every other day. (Patient not taking: Reported on 12/10/2021)   Not Taking  ? finasteride (PROSCAR) 5 MG tablet Take 5 mg by mouth daily. (Patient not taking: Reported on 12/10/2021)   Not Taking  ? spironolactone (ALDACTONE) 25 MG tablet Take 25 mg by mouth daily as needed. (Patient not taking: Reported on 12/10/2021)   Not Taking  ? ? ? ?No Known Allergies ? ? ?Past Medical History:  ?Diagnosis Date  ? A-fib ([place])   ? Achalasia   ? BPH (benign prostatic hyperplasia)   ? Chronic kidney disease   ? kidney stones  ? 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 Constipation   ? Diabetes mellitus without complication ([place])   ? Dysrhythmia   ? a-fib  ? 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 Gastroparesis   ? GERD (gastroesophageal reflux disease)   ? Headache   ? Hearing loss   ? Hyperlipemia   ? Hypertension   ? Pancytopenia ([place])   ? Peripheral neuropathy   ? Presence of permanent cardiac pacemaker   ? Sleep apnea   ? TIA (transient ischemic attack)   ? Ulcerative colitis ([place])   ? ? ?Review of systems:  Otherwise negative.  ? ? ?Physical Exam ? ?Gen: Alert, oriented. Appears stated age.  ?HEENT: [place]/AT. PERRLA. ?Lungs: CTA, no wheezes. ?CV: RR nl S1, S2. ?Abd: soft, benign, no masses. BS+ ?Ext: No edema. Pulses 2+ ? ? ? ?Planned procedures: Proceed with colonoscopy. The patient understands the nature of the planned procedure, indications, risks, alternatives and potential complications including but not limited to bleeding, infection, perforation, damage to internal organs and possible oversedation/side effects from anesthesia. The patient agrees and gives consent to proceed.  ?Please refer to procedure notes for findings, recommendations and patient disposition/instructions.  ? ? ? ?Rosalene Wardrop K. Alice Reichert,  M.D. ?Gastroenterology ?12/10/2021  8:45 AM ? ? ? ? ? ? ?

## 2021-12-10 NOTE — Op Note (Signed)
[facility] ?Gastroenterology ?Patient Name: Bernard Pennington ?Procedure Date: 12/10/2021 7:43 AM ?MRN: [id number] ?Account #: [id number] ?Date of Birth: December 06, 1945 ?Admit Type: Outpatient ?Age: 76 ?Room: [facility] ENDO ROOM 2 ?Gender: Male ?Note Status: Finalized ?Instrument Name: Colonscope [id number] ?Procedure:             Colonoscopy ?Indications:           High risk colon cancer surveillance: Personal history  ?                       of multiple (3 or more) adenomas, High risk colon  ?                       cancer surveillance: Personal history of sessile  ?                       serrated colon polyp (less than 10 mm in size) with no  ?                       dysplasia ?Providers:             Benay Pike. Alice Reichert MD, MD ?Referring MD:          Wynona Canes. Kym Groom, MD (Referring MD) ?Medicines:             Propofol per Anesthesia ?Complications:         No immediate complications. ?Procedure:             Pre-Anesthesia Assessment: ?                       - The risks and benefits of the procedure and the  ?                       sedation options and risks were discussed with the  ?                       patient. All questions were answered and informed  ?                       consent was obtained. ?                       - Patient identification and proposed procedure were  ?                       verified prior to the procedure by the nurse. The  ?                       procedure was verified in the procedure room. ?                       - ASA Grade Assessment: III - A patient with severe  ?                       systemic disease. ?                       - After reviewing the risks and benefits, the patient  ?  was deemed in satisfactory condition to undergo the  ?                       procedure. ?                       After obtaining informed consent, the colonoscope was  ?                       passed under direct vision. Throughout the procedure,  ?                       the  patient's blood pressure, pulse, and oxygen  ?                       saturations were monitored continuously. The  ?                       Colonoscope was introduced through the anus and  ?                       advanced to the the cecum, identified by appendiceal  ?                       orifice and ileocecal valve. The colonoscopy was  ?                       performed without difficulty. The patient tolerated  ?                       the procedure well. The quality of the bowel  ?                       preparation was good. ?Findings: ?     The perianal and digital rectal examinations were normal. Pertinent  ?     negatives include normal sphincter tone and no palpable rectal lesions. ?     Non-bleeding internal hemorrhoids were found during retroflexion. The  ?     hemorrhoids were Grade I (internal hemorrhoids that do not prolapse). ?     A 5 mm polyp was found in the rectum. The polyp was sessile. The polyp  ?     was removed with a jumbo cold forceps. The polyp was removed with a hot  ?     snare. Resection and retrieval were complete. ?     Two sessile polyps were found in the transverse colon. The polyps were 3  ?     to 5 mm in size. These polyps were removed with a jumbo cold forceps.  ?     Resection and retrieval were complete. ?     A 8 mm polyp was found in the descending colon. The polyp was sessile.  ?     The polyp was removed with a hot snare. Resection and retrieval were  ?     complete. ?     The exam was otherwise without abnormality. ?Impression:            - Non-bleeding internal hemorrhoids. ?                       - One 5 mm polyp in the  rectum, removed with a jumbo  ?                       cold forceps and removed with a hot snare. Resected  ?                       and retrieved. ?                       - Two 3 to 5 mm polyps in the transverse colon,  ?                       removed with a jumbo cold forceps. Resected and  ?                       retrieved. ?                       - One 8  mm polyp in the descending colon, removed with  ?                       a hot snare. Resected and retrieved. ?                       - The examination was otherwise normal. ?Recommendation:        - Patient has a contact number available for  ?                       emergencies. The signs and symptoms of potential  ?                       delayed complications were discussed with the patient.  ?                       Return to normal activities tomorrow. Written  ?                       discharge instructions were provided to the patient. ?                       - Resume previous diet. ?                       - Continue present medications. ?                       - Repeat colonoscopy is recommended for surveillance.  ?                       The colonoscopy date will be determined after  ?                       pathology results from today's exam become available  ?                       for review. ?                       - Return to GI office PRN. ?                       -  The findings and recommendations were discussed with  ?                       the patient. ?                       - Resume Eliquis (apixaban) at prior dose tomorrow.  ?                       Refer to managing physician for further adjustment of  ?                       therapy. ?Procedure Code(s):     --- Professional --- ?                       [phone], Colonoscopy, flexible; with removal of  ?                       tumor(s), polyp(s), or other lesion(s) by snare  ?                       technique ?                       45380, 59, Colonoscopy, flexible; with biopsy, single  ?                       or multiple ?Diagnosis Code(s):     --- Professional --- ?                       Z86.010, Personal history of colonic polyps ?                       K64.0, First degree hemorrhoids ?                       K63.5, Polyp of colon ?                       K62.1, Rectal polyp ?CPT copyright 2019 American Medical Association. All rights reserved. ?The codes  documented in this report are preliminary and upon coder review may  ?be revised to meet current compliance requirements. ?Efrain Sella MD, MD ?12/10/2021 9:21:10 AM ?This report has been signed electronically. ?Number of Addenda: 0 ?Note Initiated On: 12/10/2021 7:43 AM ?Scope Withdrawal Time: 0 hours 8 minutes 51 seconds  ?Total Procedure Duration: 0 hours 22 minutes 8 seconds  ?Estimated Blood Loss:  Estimated blood loss: none. ?     [facility] ?

## 2021-12-10 NOTE — Anesthesia Procedure Notes (Signed)
Procedure Name: General with mask airway ?Date/Time: 12/10/2021 10:30 AM ?Performed by: Kelton Pillar, CRNA ?Pre-anesthesia Checklist: Patient identified, Emergency Drugs available, Suction available and Patient being monitored ?Patient Re-evaluated:Patient Re-evaluated prior to induction ?Oxygen Delivery Method: Simple face mask ?Induction Type: IV induction ?Placement Confirmation: positive ETCO2 and CO2 detector ?Dental Injury: Teeth and Oropharynx as per pre-operative assessment  ? ? ? ? ?

## 2021-12-10 NOTE — Interval H&P Note (Signed)
History and Physical Interval Note: ? ?12/10/2021 ?8:48 AM ? ?Bernard Pennington  has presented today for surgery, with the diagnosis of PRS HX COLON POLYPS ?ULCERATIVE COLITIS.  The various methods of treatment have been discussed with the patient and family. After consideration of risks, benefits and other options for treatment, the patient has consented to  Procedure(s) with comments: ?COLONOSCOPY WITH PROPOFOL (N/A) - IDDM as a surgical intervention.  The patient's history has been reviewed, patient examined, no change in status, stable for surgery.  I have reviewed the patient's chart and labs.  Questions were answered to the patient's satisfaction.   ? ? ?[place], [place] ? ? ?

## 2021-12-10 NOTE — Anesthesia Preprocedure Evaluation (Signed)
Anesthesia Evaluation  ?Patient identified by MRN, date of birth, ID band ?Patient awake ? ? ? ?Reviewed: ?Allergy & Precautions, H&P , NPO status , Patient's Chart, lab work & pertinent test results, reviewed documented beta blocker date and time  ? ?History of Anesthesia Complications ?Negative for: history of anesthetic complications ? ?Airway ?Mallampati: III ? ?TM Distance: >3 FB ?Neck ROM: full ? ? ? Dental ? ?(+) Caps, Dental Advidsory Given, Missing ?  ?Pulmonary ?neg shortness of breath, sleep apnea and Continuous Positive Airway Pressure Ventilation , neg COPD, neg recent URI, former smoker,  ?  ?breath sounds clear to auscultation ? ? ? ? ? ? Cardiovascular ?Exercise Tolerance: Good ?hypertension, (-) angina(-) CAD, (-) Past MI, (-) Cardiac Stents and (-) CABG + dysrhythmias Atrial Fibrillation + pacemaker (-) Valvular Problems/Murmurs ?Rhythm:Regular Rate:Normal ? ? ?  ?Neuro/Psych ? Headaches, neg Seizures TIAnegative psych ROS  ? GI/Hepatic ?PUD, GERD  ,(+) Cirrhosis  ?  ?  ? ,   ?Endo/Other  ?diabetes, Insulin Dependent ? Renal/GU ?CRFRenal disease (kidney stones)  ?negative genitourinary ?  ?Musculoskeletal ? ? Abdominal ?  ?Peds ? Hematology ?negative hematology ROS ?(+)   ?Anesthesia Other Findings ?Past Medical History: ?No date: A-fib [facility]) ?No date: Achalasia ?No date: BPH (benign prostatic hyperplasia) ?No date: Chronic kidney disease ?    Comment:  kidney stones ?No date: Cirrhosis ([place]) ?No date: Constipation ?No date: Diabetes mellitus without complication ([place]) ?No date: Dysrhythmia ?    Comment:  a-fib ?No date: Esophageal varices ([place]) ?No date: GERD (gastroesophageal reflux disease) ?No date: Headache ?No date: Hyperlipemia ?No date: Hypertension ?No date: Pancytopenia ([place]) ?No date: Sleep apnea ?No date: TIA (transient ischemic attack) ?No date: Ulcerative colitis ([place]) ? ? Reproductive/Obstetrics ?negative OB ROS ? ?  ? ? ? ? ? ? ? ? ? ? ? ? ? ?  ?   ? ? ? ? ? ? ? ? ?Anesthesia Physical ? ?Anesthesia Plan ? ?ASA: 3 ? ?Anesthesia Plan: General  ? ?Post-op Pain Management: Minimal or no pain anticipated  ? ?Induction: Intravenous ? ?PONV Risk Score and Plan: 2 and Propofol infusion ? ?Airway Management Planned: Nasal Cannula ? ?Additional Equipment: None ? ?Intra-op Plan:  ? ?Post-operative Plan:  ? ?Informed Consent: I have reviewed the patients History and Physical, chart, labs and discussed the procedure including the risks, benefits and alternatives for the proposed anesthesia with the patient or authorized representative who has indicated his/her understanding and acceptance.  ? ? ? ?Dental Advisory Given ? ?Plan Discussed with: Anesthesiologist, CRNA and Surgeon ? ?Anesthesia Plan Comments: (Discussed risks of anesthesia with patient, including possibility of difficulty with spontaneous ventilation under anesthesia necessitating airway intervention, PONV, and rare risks such as cardiac or respiratory or neurological events, and allergic reactions. Discussed the role of CRNA in patient's perioperative care. Patient understands.)  ? ? ? ? ? ? ?Anesthesia Quick Evaluation ? ?

## 2021-12-10 NOTE — Transfer of Care (Signed)
Immediate Anesthesia Transfer of Care Note ? ?Patient: Bernard Pennington ? ?Procedure(s) Performed: COLONOSCOPY WITH PROPOFOL ? ?Patient Location: Endoscopy Unit ? ?Anesthesia Type:General ? ?Level of Consciousness: drowsy and patient cooperative ? ?Airway & Oxygen Therapy: Patient Spontanous Breathing and Patient connected to face mask oxygen ? ?Post-op Assessment: Report given to RN and Post -op Vital signs reviewed and stable ? ?Post vital signs: Reviewed and stable ? ?Last Vitals:  ?Vitals Value Taken Time  ?BP 86/65 12/10/21 0920  ?Temp    ?Pulse 60 12/10/21 0920  ?Resp 10 12/10/21 0920  ?SpO2 99 % 12/10/21 0920  ? ? ?Last Pain:  ?Vitals:  ? 12/10/21 0920  ?TempSrc:   ?PainSc: Asleep  ?   ? ?  ? ?Complications: No notable events documented. ?

## 2021-12-10 NOTE — Anesthesia Postprocedure Evaluation (Signed)
Anesthesia Post Note ? ?Patient: Bernard Pennington ? ?Procedure(s) Performed: COLONOSCOPY WITH PROPOFOL ? ?Patient location during evaluation: Endoscopy ?Anesthesia Type: General ?Level of consciousness: awake and alert ?Pain management: pain level controlled ?Vital Signs Assessment: post-procedure vital signs reviewed and stable ?Respiratory status: spontaneous breathing, nonlabored ventilation, respiratory function stable and patient connected to nasal cannula oxygen ?Cardiovascular status: blood pressure returned to baseline and stable ?Postop Assessment: no apparent nausea or vomiting ?Anesthetic complications: no ? ? ?No notable events documented. ? ? ?Last Vitals:  ?Vitals:  ? 12/10/21 0930 12/10/21 0939  ?BP: 95/62 103/65  ?Pulse: 60 66  ?Resp: 15 12  ?Temp: (!) 35.9 ?C   ?SpO2: 96% 97%  ?  ?Last Pain:  ?Vitals:  ? 12/10/21 0939  ?TempSrc:   ?PainSc: 0-No pain  ? ? ?  ?  ?  ?  ?  ?  ? ?Arita Miss ? ? ? ? ?

## 2021-12-11 ENCOUNTER — Encounter: Payer: Self-pay | Admitting: Emergency Medicine

## 2021-12-11 ENCOUNTER — Emergency Department: Payer: Medicare Other

## 2021-12-11 ENCOUNTER — Observation Stay
Admission: EM | Admit: 2021-12-11 | Discharge: 2021-12-12 | Disposition: A | Discharge status: Home / Self Care | Payer: Medicare Other | Attending: Internal Medicine | Admitting: Internal Medicine

## 2021-12-11 ENCOUNTER — Other Ambulatory Visit: Payer: Self-pay

## 2021-12-11 DIAGNOSIS — Z79899 Other long term (current) drug therapy: Secondary | ICD-10-CM | POA: Insufficient documentation

## 2021-12-11 DIAGNOSIS — S2249XA Multiple fractures of ribs, unspecified side, initial encounter for closed fracture: Secondary | ICD-10-CM | POA: Diagnosis present

## 2021-12-11 DIAGNOSIS — K7469 Other cirrhosis of liver: Secondary | ICD-10-CM | POA: Diagnosis not present

## 2021-12-11 DIAGNOSIS — I482 Chronic atrial fibrillation, unspecified: Secondary | ICD-10-CM | POA: Diagnosis not present

## 2021-12-11 DIAGNOSIS — W06XXXA Fall from bed, initial encounter: Secondary | ICD-10-CM | POA: Insufficient documentation

## 2021-12-11 DIAGNOSIS — W19XXXA Unspecified fall, initial encounter: Secondary | ICD-10-CM | POA: Diagnosis present

## 2021-12-11 DIAGNOSIS — I951 Orthostatic hypotension: Secondary | ICD-10-CM | POA: Diagnosis not present

## 2021-12-11 DIAGNOSIS — I10 Essential (primary) hypertension: Secondary | ICD-10-CM | POA: Diagnosis present

## 2021-12-11 DIAGNOSIS — G4733 Obstructive sleep apnea (adult) (pediatric): Secondary | ICD-10-CM | POA: Insufficient documentation

## 2021-12-11 DIAGNOSIS — R519 Headache, unspecified: Secondary | ICD-10-CM | POA: Diagnosis not present

## 2021-12-11 DIAGNOSIS — M25511 Pain in right shoulder: Secondary | ICD-10-CM | POA: Diagnosis not present

## 2021-12-11 DIAGNOSIS — I129 Hypertensive chronic kidney disease with stage 1 through stage 4 chronic kidney disease, or unspecified chronic kidney disease: Secondary | ICD-10-CM | POA: Insufficient documentation

## 2021-12-11 DIAGNOSIS — F32A Depression, unspecified: Secondary | ICD-10-CM | POA: Diagnosis not present

## 2021-12-11 DIAGNOSIS — Z96652 Presence of left artificial knee joint: Secondary | ICD-10-CM | POA: Insufficient documentation

## 2021-12-11 DIAGNOSIS — S299XXA Unspecified injury of thorax, initial encounter: Secondary | ICD-10-CM | POA: Diagnosis present

## 2021-12-11 DIAGNOSIS — Z87891 Personal history of nicotine dependence: Secondary | ICD-10-CM | POA: Insufficient documentation

## 2021-12-11 DIAGNOSIS — M25551 Pain in right hip: Secondary | ICD-10-CM | POA: Insufficient documentation

## 2021-12-11 DIAGNOSIS — Z7984 Long term (current) use of oral hypoglycemic drugs: Secondary | ICD-10-CM | POA: Insufficient documentation

## 2021-12-11 DIAGNOSIS — Z794 Long term (current) use of insulin: Secondary | ICD-10-CM | POA: Insufficient documentation

## 2021-12-11 DIAGNOSIS — S2241XA Multiple fractures of ribs, right side, initial encounter for closed fracture: Principal | ICD-10-CM | POA: Insufficient documentation

## 2021-12-11 DIAGNOSIS — N189 Chronic kidney disease, unspecified: Secondary | ICD-10-CM | POA: Insufficient documentation

## 2021-12-11 DIAGNOSIS — W19XXXD Unspecified fall, subsequent encounter: Secondary | ICD-10-CM

## 2021-12-11 DIAGNOSIS — Z7901 Long term (current) use of anticoagulants: Secondary | ICD-10-CM | POA: Insufficient documentation

## 2021-12-11 DIAGNOSIS — E119 Type 2 diabetes mellitus without complications: Secondary | ICD-10-CM

## 2021-12-11 DIAGNOSIS — E1140 Type 2 diabetes mellitus with diabetic neuropathy, unspecified: Secondary | ICD-10-CM

## 2021-12-11 DIAGNOSIS — Z950 Presence of cardiac pacemaker: Secondary | ICD-10-CM | POA: Insufficient documentation

## 2021-12-11 DIAGNOSIS — E1122 Type 2 diabetes mellitus with diabetic chronic kidney disease: Secondary | ICD-10-CM | POA: Insufficient documentation

## 2021-12-11 LAB — CBC WITH DIFFERENTIAL/PLATELET
Abs Immature Granulocytes: 0.03 10*3/uL (ref 0.00–0.07)
Basophils Absolute: 0 10*3/uL (ref 0.0–0.1)
Basophils Relative: 0 %
Eosinophils Absolute: 0 10*3/uL (ref 0.0–0.5)
Eosinophils Relative: 1 %
HCT: 37.7 % — ABNORMAL LOW (ref 39.0–52.0)
Hemoglobin: 12.3 g/dL — ABNORMAL LOW (ref 13.0–17.0)
Immature Granulocytes: 1 %
Lymphocytes Relative: 23 %
Lymphs Abs: 1.5 10*3/uL (ref 0.7–4.0)
MCH: 31.8 pg (ref 26.0–34.0)
MCHC: 32.6 g/dL (ref 30.0–36.0)
MCV: 97.4 fL (ref 80.0–100.0)
Monocytes Absolute: 0.6 10*3/uL (ref 0.1–1.0)
Monocytes Relative: 10 %
Neutro Abs: 4.1 10*3/uL (ref 1.7–7.7)
Neutrophils Relative %: 65 %
Platelets: 94 10*3/uL — ABNORMAL LOW (ref 150–400)
RBC: 3.87 MIL/uL — ABNORMAL LOW (ref 4.22–5.81)
RDW: 12.8 % (ref 11.5–15.5)
WBC: 6.2 10*3/uL (ref 4.0–10.5)
nRBC: 0 % (ref 0.0–0.2)

## 2021-12-11 LAB — COMPREHENSIVE METABOLIC PANEL
ALT: 18 U/L (ref 0–44)
AST: 29 U/L (ref 15–41)
Albumin: 3.7 g/dL (ref 3.5–5.0)
Alkaline Phosphatase: 101 U/L (ref 38–126)
Anion gap: 10 (ref 5–15)
BUN: 20 mg/dL (ref 8–23)
CO2: 21 mmol/L — ABNORMAL LOW (ref 22–32)
Calcium: 8.6 mg/dL — ABNORMAL LOW (ref 8.9–10.3)
Chloride: 107 mmol/L (ref 98–111)
Creatinine, Ser: 1.6 mg/dL — ABNORMAL HIGH (ref 0.61–1.24)
GFR, Estimated: 44 mL/min — ABNORMAL LOW (ref >60)
Glucose, Bld: 232 mg/dL — ABNORMAL HIGH (ref 70–99)
Potassium: 4.5 mmol/L (ref 3.5–5.1)
Sodium: 138 mmol/L (ref 135–145)
Total Bilirubin: 0.9 mg/dL (ref 0.3–1.2)
Total Protein: 6.7 g/dL (ref 6.5–8.1)

## 2021-12-11 LAB — GLUCOSE, CAPILLARY: Glucose-Capillary: 251 mg/dL — ABNORMAL HIGH (ref 70–99)

## 2021-12-11 LAB — SURGICAL PATHOLOGY

## 2021-12-11 MED ORDER — OMEGA-3-ACID ETHYL ESTERS 1 G PO CAPS
1000.0000 mg | ORAL_CAPSULE | Freq: Two times a day (BID) | ORAL | Status: DC
Start: 2021-12-11 — End: 2021-12-12
  Administered 2021-12-11 – 2021-12-12 (×2): 1000 mg via ORAL
  Filled 2021-12-11 (×2): qty 1

## 2021-12-11 MED ORDER — SODIUM CHLORIDE 0.9 % IV SOLN: 250.0000 mL | INTRAVENOUS | Status: DC | PRN

## 2021-12-11 MED ORDER — ADULT MULTIVITAMIN W/MINERALS CH
1.0000 | ORAL_TABLET | Freq: Every day | ORAL | Status: DC
  Administered 2021-12-11 – 2021-12-12 (×2): 1 via ORAL
  Filled 2021-12-11 (×2): qty 1

## 2021-12-11 MED ORDER — TAMSULOSIN HCL 0.4 MG PO CAPS
0.8000 mg | ORAL_CAPSULE | Freq: Every day | ORAL | Status: DC
  Administered 2021-12-11 – 2021-12-12 (×2): 0.8 mg via ORAL
  Filled 2021-12-11 (×2): qty 2

## 2021-12-11 MED ORDER — OXYCODONE HCL 5 MG PO TABS
5.0000 mg | ORAL_TABLET | ORAL | Status: DC | PRN
  Administered 2021-12-11 – 2021-12-12 (×3): 5 mg via ORAL
  Filled 2021-12-11 (×4): qty 1

## 2021-12-11 MED ORDER — INSULIN ASPART 100 UNIT/ML IJ SOLN
0.0000 [IU] | Freq: Three times a day (TID) | INTRAMUSCULAR | Status: DC
  Administered 2021-12-11: 8 [IU] via SUBCUTANEOUS
  Administered 2021-12-12: 3 [IU] via SUBCUTANEOUS
  Administered 2021-12-12: 5 [IU] via SUBCUTANEOUS
  Filled 2021-12-11 (×2): qty 1

## 2021-12-11 MED ORDER — MORPHINE SULFATE (PF) 4 MG/ML IV SOLN
4.0000 mg | Freq: Once | INTRAVENOUS | Status: AC
  Administered 2021-12-11: 4 mg via INTRAMUSCULAR
  Filled 2021-12-11: qty 1

## 2021-12-11 MED ORDER — SODIUM CHLORIDE 0.9% FLUSH: 3.0000 mL | INTRAVENOUS | Status: DC | PRN

## 2021-12-11 MED ORDER — SODIUM CHLORIDE 0.9 % IV BOLUS
1000.0000 mL | Freq: Once | INTRAVENOUS | Status: AC
  Administered 2021-12-11: 1000 mL via INTRAVENOUS

## 2021-12-11 MED ORDER — OXYCODONE-ACETAMINOPHEN 5-325 MG PO TABS
1.0000 | ORAL_TABLET | Freq: Once | ORAL | Status: AC
  Administered 2021-12-11: 1 via ORAL
  Filled 2021-12-11: qty 1

## 2021-12-11 MED ORDER — ONDANSETRON HCL 4 MG/2ML IJ SOLN
4.0000 mg | Freq: Four times a day (QID) | INTRAMUSCULAR | Status: DC | PRN
Start: 2021-12-11 — End: 2021-12-12

## 2021-12-11 MED ORDER — TRAMADOL HCL 50 MG PO TABS
50.0000 mg | ORAL_TABLET | Freq: Three times a day (TID) | ORAL | Status: DC | PRN
  Administered 2021-12-12: 50 mg via ORAL
  Filled 2021-12-11: qty 1

## 2021-12-11 MED ORDER — CITALOPRAM HYDROBROMIDE 10 MG PO TABS
30.0000 mg | ORAL_TABLET | Freq: Every day | ORAL | Status: DC
  Administered 2021-12-11 – 2021-12-12 (×2): 30 mg via ORAL
  Filled 2021-12-11 (×2): qty 3

## 2021-12-11 MED ORDER — ONDANSETRON 4 MG PO TBDP: 4.0000 mg | ORAL_TABLET | Freq: Three times a day (TID) | ORAL | 0 refills | Status: AC | PRN

## 2021-12-11 MED ORDER — LORATADINE 10 MG PO TABS
10.0000 mg | ORAL_TABLET | Freq: Every day | ORAL | Status: DC
  Administered 2021-12-11 – 2021-12-12 (×2): 10 mg via ORAL
  Filled 2021-12-11 (×2): qty 1

## 2021-12-11 MED ORDER — LIDOCAINE 5 % EX PTCH: 1.0000 | MEDICATED_PATCH | Freq: Two times a day (BID) | CUTANEOUS | 0 refills | Status: AC

## 2021-12-11 MED ORDER — SODIUM CHLORIDE 0.9% FLUSH
3.0000 mL | Freq: Two times a day (BID) | INTRAVENOUS | Status: DC
  Administered 2021-12-11 – 2021-12-12 (×2): 3 mL via INTRAVENOUS

## 2021-12-11 MED ORDER — NAPROXEN 500 MG PO TABS: 500.0000 mg | ORAL_TABLET | Freq: Two times a day (BID) | ORAL | 0 refills | Status: AC

## 2021-12-11 MED ORDER — ONDANSETRON 4 MG PO TBDP
8.0000 mg | ORAL_TABLET | Freq: Once | ORAL | Status: AC | PRN
  Administered 2021-12-11: 8 mg via ORAL
  Filled 2021-12-11: qty 2

## 2021-12-11 MED ORDER — NIACIN ER (ANTIHYPERLIPIDEMIC) 500 MG PO TBCR
1000.0000 mg | EXTENDED_RELEASE_TABLET | Freq: Two times a day (BID) | ORAL | Status: DC
  Administered 2021-12-11 – 2021-12-12 (×2): 1000 mg via ORAL
  Filled 2021-12-11 (×2): qty 2

## 2021-12-11 MED ORDER — ONDANSETRON HCL 4 MG PO TABS: 4.0000 mg | ORAL_TABLET | Freq: Four times a day (QID) | ORAL | Status: DC | PRN

## 2021-12-11 MED ORDER — GABAPENTIN 400 MG PO CAPS
800.0000 mg | ORAL_CAPSULE | Freq: Three times a day (TID) | ORAL | Status: DC
  Administered 2021-12-11 – 2021-12-12 (×2): 800 mg via ORAL
  Filled 2021-12-11 (×2): qty 2

## 2021-12-11 MED ORDER — SOTALOL HCL 80 MG PO TABS
80.0000 mg | ORAL_TABLET | Freq: Every day | ORAL | Status: DC
  Administered 2021-12-11 – 2021-12-12 (×2): 80 mg via ORAL
  Filled 2021-12-11 (×2): qty 1

## 2021-12-11 MED ORDER — OXYCODONE HCL 5 MG PO TABS: 5.0000 mg | ORAL_TABLET | ORAL | 0 refills | Status: AC | PRN

## 2021-12-11 MED ORDER — APIXABAN 2.5 MG PO TABS
2.5000 mg | ORAL_TABLET | Freq: Two times a day (BID) | ORAL | Status: DC
  Administered 2021-12-11 – 2021-12-12 (×2): 2.5 mg via ORAL
  Filled 2021-12-11 (×2): qty 1

## 2021-12-11 MED ORDER — GABAPENTIN 800 MG PO TABS
800.0000 mg | ORAL_TABLET | Freq: Three times a day (TID) | ORAL | Status: DC
  Filled 2021-12-11: qty 1

## 2021-12-11 NOTE — Assessment & Plan Note (Addendum)
Patient with known history of liver cirrhosis with evidence of portal hypertension ?Continue beta-blocker ?-Continue Lasix and spironolactone at discharge ?

## 2021-12-11 NOTE — Assessment & Plan Note (Addendum)
Maintain consistent carbohydrate diet ?-Continue home regimen at discharge ?

## 2021-12-11 NOTE — ED Notes (Signed)
Hospitalist at bedside 

## 2021-12-11 NOTE — ED Provider Notes (Addendum)
? ?[facility] ?Provider Note ? ? ? Event Date/Time  ? First MD Initiated Contact with Patient 12/11/21 0701   ?  (approximate) ? ? ?History  ? ?Fall ? ? ?HPI ? ?Bernard Pennington is a 76 y.o. adult with a past history of atrial fibrillation, diabetes, hypertension who comes the ED due to a fall out of bed yesterday.  The patient is normally on Eliquis but has not taken it in about 5 days because he was undergoing colonoscopy yesterday.  After the procedure yesterday morning, he was sleeping in his bed when he had a nightmare causing him to thrash and roll out of bed and fall on the floor.  He fell onto his right side, hitting his head on the ground.  He has had severe pain in the right shoulder since then.  Also complains of headache and right hip pain.  He was able to stand afterward and bear weight.  Denies muscle weakness or loss of sensation. ?  ? ? ?Physical Exam  ? ?Triage Vital Signs: ?ED Triage Vitals  ?Enc Vitals Group  ?   BP 12/11/21 0649 100/72  ?   Pulse Rate 12/11/21 0649 (!) 110  ?   Resp 12/11/21 0649 18  ?   Temp 12/11/21 0649 98 ?F (36.7 ?C)  ?   Temp Source 12/11/21 0649 Oral  ?   SpO2 12/11/21 0649 99 %  ?   Weight 12/11/21 0651 205 lb 0.4 oz (93 kg)  ?   Height 12/11/21 0651 '6\' 1"'$  (1.854 m)  ?   Head Circumference --   ?   Peak Flow --   ?   Pain Score 12/11/21 0651 6  ?   Pain Loc --   ?   Pain Edu? --   ?   Excl. in [place]? --   ? ? ?Most recent vital signs: ?Vitals:  ? 12/11/21 1400 12/11/21 1430  ?BP: 119/75 137/77  ?Pulse: 91 75  ?Resp: 13 16  ?Temp:    ?SpO2: 93% 97%  ? ? ? ?General: Awake, no distress.  ?CV:  Good peripheral perfusion.  Irregularly irregular rhythm ?Resp:  Normal effort.  ?Abd:  No distention.  Soft and nontender ?Other:  No midline spinal tenderness.  No scalp wounds or hematoma or ecchymosis.  Chest wall is nontender, scapula nontender.  There is tenderness at the right shoulder joint and proximal humerus with arm held in internal rotation.  There is  also some tenderness at the right hip but no leg shortening. ? ? ?ED Results / Procedures / Treatments  ? ?Labs ?(all labs ordered are listed, but only abnormal results are displayed) ?Labs Reviewed - No data to display ? ? ?EKG ? ? ? ? ?RADIOLOGY ?CT head viewed and interpreted by me, negative for intracranial hemorrhage.  Radiology report reviewed. ? ?CT cervical spine viewed by me, no C-spine injury.  Right first rib has a nondisplaced fracture.  Radiology report reviewed. ? ?X-ray right hip and pelvis unremarkable.  X-ray right shoulder and humerus negative for fracture or dislocation of the humerus or clavicle.  There are nondisplaced posterior fractures of the first third and fourth ribs.  Radiology report reviewed. ? ? ? ?PROCEDURES: ? ?Critical Care performed: No ? ?Procedures ? ? ?MEDICATIONS ORDERED IN ED: ?Medications  ?morphine (PF) 4 MG/ML injection 4 mg (4 mg Intramuscular Given 12/11/21 0725)  ?ondansetron (ZOFRAN-ODT) disintegrating tablet 8 mg (8 mg Oral Given 12/11/21 0724)  ?oxyCODONE-acetaminophen (PERCOCET/ROXICET) 5-325 MG  per tablet 1 tablet (1 tablet Oral Given 12/11/21 1011)  ?sodium chloride 0.9 % bolus 1,000 mL (0 mLs Intravenous Stopped 12/11/21 1431)  ? ? ? ?IMPRESSION / MDM / ASSESSMENT AND PLAN / ED COURSE  ?I reviewed the triage vital signs and the nursing notes. ?             ?               ? ?Differential diagnosis includes, but is not limited to, intracranial hemorrhage, C-spine fracture, rib fracture, humerus fracture, shoulder dislocation, clavicle fracture, AC joint separation ? ?Comes the ED with right arm and shoulder pain, right scapular pain, right hip pain and headache after a fall out of bed.  Imaging reveals nondisplaced fractures posteriorly of the first third and fourth ribs from the fall.  No evidence of hemothorax or pneumothorax.  No intracranial hemorrhage or C-spine injury.  There is a small TP fracture of the T-spine, inconsequential other than pain control.  Right  hip and pelvis are okay. ? ? ? ?----------------------------------------- ?3:22 PM on 12/11/2021 ?----------------------------------------- ?With attempted pain control, patient is still having severe pain.  He is also now feeling dizzy which is worse with standing.  Orthostatic vital signs show orthostatic hypotension.  With his age and multiple rib fractures, he warrants hospitalization for further pain control and supportive care.  Will contact hospitalist. ? ? ?  ? ? ?FINAL CLINICAL IMPRESSION(S) / ED DIAGNOSES  ? ?Final diagnoses:  ?Right hip pain  ?Closed fracture of three ribs of right side, initial encounter  ?Orthostatic hypotension  ? ? ? ?Rx / DC Orders  ? ?ED Discharge Orders   ? ?      Ordered  ?  oxyCODONE (ROXICODONE) 5 MG immediate release tablet  Every 4 hours PRN       ? 12/11/21 1038  ?  naproxen (NAPROSYN) 500 MG tablet  2 times daily with meals       ? 12/11/21 1038  ?  ondansetron (ZOFRAN-ODT) 4 MG disintegrating tablet  Every 8 hours PRN       ? 12/11/21 1038  ?  lidocaine (LIDODERM) 5 %  Every 12 hours       ? 12/11/21 1038  ? ?  ?  ? ?  ? ? ? ?Note:  This document was prepared using Dragon voice recognition software and may include unintentional dictation errors. ?  ?Carrie Mew, MD ?12/11/21 1054 ? ?  ?Carrie Mew, MD ?12/11/21 1523 ? ?

## 2021-12-11 NOTE — Care Management Obs Status (Signed)
MEDICARE OBSERVATION STATUS NOTIFICATION ? ? ?Patient Details  ?Name: Bernard Pennington ?MRN: [id number] ?Date of Birth: 1946/08/21 ? ? ?Medicare Observation Status Notification Given:  Yes ? ? ? ?Shelbie Hutching, RN ?12/11/2021, 4:09 PM ?

## 2021-12-11 NOTE — ED Triage Notes (Signed)
Pt to triage via w/c with no distress noted; reports rolled OOB yesterday afternoon landing on rt side; c/o pain to rt shoulder, head, neck and back; denies LOC ?

## 2021-12-11 NOTE — Discharge Instructions (Addendum)
Take naproxen 2 times a day for the next 7 days for pain control. ?You can use Tylenol up to 2,000 mg per day for 1-2 weeks as well.  ?You can place a lidocaine patch over the painful area on your back as well. ?Take oxycodone as needed for additional severe pain. ?Use the incentive spirometer 2 times a day to take 10 deep breaths each session to help prevent pneumonia. ?

## 2021-12-11 NOTE — Care Management CC44 (Signed)
Condition Code 44 Documentation Completed ? ?Patient Details  ?Name: Bernard Pennington ?MRN: [id number] ?Date of Birth: [birth year] ? ? ?Condition Code 44 given:  Yes ?Patient signature on Condition Code 44 notice:  Yes ?Documentation of 2 MD's agreement:  Yes ?Code 44 added to claim:  Yes ? ? ? ?Shelbie Hutching, RN ?12/11/2021, 4:09 PM ? ?

## 2021-12-11 NOTE — Assessment & Plan Note (Addendum)
Patient is status post fall out of bed with multiple rib fractures ?Pain control ?-Outpatient PT referral to [facility] ?

## 2021-12-11 NOTE — ED Notes (Signed)
Patient transported to X-ray 

## 2021-12-11 NOTE — Plan of Care (Signed)
  Problem: Clinical Measurements: Goal: Will remain free from infection Outcome: Progressing Goal: Diagnostic test results will improve Outcome: Progressing Goal: Respiratory complications will improve Outcome: Progressing   Problem: Nutrition: Goal: Adequate nutrition will be maintained Outcome: Progressing   

## 2021-12-11 NOTE — ED Notes (Signed)
Edp notified of current VS ?

## 2021-12-11 NOTE — H&P (Addendum)
?History and Physical  ? ? ?Patient: Bernard Pennington ATF:[id number] DOB: February 29, 1946 ?DOA: 12/11/2021 ?DOS: the patient was seen and examined on 12/11/2021 ?PCP: Valera Castle, MD  ?Patient coming from: Home ? ?Chief Complaint:  ?Chief Complaint  ?Patient presents with  ? Fall  ? ?HPI: Bernard Pennington is a 76 y.o. adult with medical history significant for ulcerative colitis, diabetes mellitus, liver cirrhosis with portal hypertension, A-fib, TIA who presents to the ER for evaluation after a fall at home. ?Patient had a colonoscopy on 12/10/21 and postprocedure he states that he had a nightmare which caused him to trash and roll out of bed landing on his right side and hitting his head.  Since then he has had severe pain in his right shoulder, in his mid back and right lateral chest wall pain with deep inspiration and when he coughs. ?He also complains of a headache mostly involving the left forehead.  He has a history of migraine headaches but has not had one in a long time.  He did have an episode of dizziness and orthostatic blood pressure changes in the ER after receiving pain medication. ?He denies having any chest pain, no nausea, no vomiting, no leg swelling no shortness of breath, no urinary symptoms, no changes in his bowel habits, no focal deficits or blurred vision. ?Review of Systems: As mentioned in the history of present illness. All other systems reviewed and are negative. ?Past Medical History:  ?Diagnosis Date  ? A-fib ([place])   ? Achalasia   ? BPH (benign prostatic hyperplasia)   ? Chronic kidney disease   ? kidney stones  ? Cirrhosis ([place])   ? alcoholic  ? Constipation   ? Diabetes mellitus without complication ([place])   ? Dysrhythmia   ? a-fib  ? 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 Gastroparesis   ? GERD (gastroesophageal reflux disease)   ? Headache   ? Hearing loss   ? Hyperlipemia   ? Hypertension   ? Pancytopenia ([place])   ? Peripheral neuropathy   ? Presence of permanent cardiac pacemaker   ?  Sleep apnea   ? TIA (transient ischemic attack)   ? Ulcerative colitis ([place])   ? ?Past Surgical History:  ?Procedure Laterality Date  ? CHOLECYSTECTOMY    ? COLONOSCOPY WITH PROPOFOL N/A 08/25/2017  ? Procedure: COLONOSCOPY WITH PROPOFOL;  Surgeon: Toledo, Benay Pike, MD;  Location: [facility] ENDOSCOPY;  Service: Gastroenterology;  Laterality: N/A;  ? ESOPHAGOGASTRODUODENOSCOPY (EGD) WITH PROPOFOL N/A 08/25/2017  ? Procedure: ESOPHAGOGASTRODUODENOSCOPY (EGD) WITH PROPOFOL;  Surgeon: Toledo, Benay Pike, MD;  Location: [facility] ENDOSCOPY;  Service: Gastroenterology;  Laterality: N/A;  ? JOINT REPLACEMENT    ? L partial knee  ? KIDNEY SURGERY    ? LITHOTRIPSY    ? VASECTOMY    ? ?Social History:  reports that she quit smoking about 40 years ago. Her smoking use included cigarettes. She has a 100.00 pack-year smoking history. She has never used smokeless tobacco. She reports that she does not drink alcohol and does not use drugs. ? ?Allergies  ?Allergen Reactions  ? Wheat Bran   ?  Other reaction(s): Migraine  ? ? ?Family History  ?Problem Relation Age of Onset  ? Heart attack Mother   ? Stroke Father   ? Cancer Father   ? Diabetes Brother   ? ? ?Prior to Admission medications   ?Medication Sig Start Date End Date Taking? Authorizing Provider  ?amLODipine (NORVASC) 5 MG tablet Take 5 mg by mouth daily. 02/29/20  Yes [provider]  ?cetirizine (ZYRTEC) 10 MG tablet Take 10 mg by mouth daily.   Yes [provider]  ?Cinnamon 500 MG capsule Take 500 mg by mouth 3 (three) times daily before meals.    Yes [provider]  ?citalopram (CELEXA) 20 MG tablet Take 30 mg by mouth daily. 03/11/20  Yes [provider]  ?cloNIDine (CATAPRES) 0.1 MG tablet Take 0.1 mg by mouth 2 (two) times daily.    Yes [provider]  ?furosemide (LASIX) 20 MG tablet Take 20 mg by mouth daily as needed for fluid or edema.   Yes [provider]  ?gabapentin (NEURONTIN) 800 MG tablet Take 800 mg by mouth 3  (three) times daily.    Yes [provider]  ?insulin aspart (NOVOLOG) 100 UNIT/ML injection Inject 10 Units into the skin See admin instructions. Inject 10u (plus sliding scale) three times daily at mealtimes - max 50u daily 03/24/20  Yes Arrien, Jimmy Picket, MD  ?insulin NPH Human (HUMULIN N,NOVOLIN N) 100 UNIT/ML injection Inject 29-31 Units into the skin 2 (two) times daily before a meal. Inject 31 units under the skin every morning and inject 29 units under the skin every night   Yes [provider]  ?lidocaine (LIDODERM) 5 % Place 1 patch onto the skin every 12 (twelve) hours. Remove & Discard patch within 12 hours or as directed by MD 12/11/21  Yes Carrie Mew, MD  ?metFORMIN (GLUCOPHAGE) 500 MG tablet Take 500 mg by mouth 2 (two) times daily with a meal.   Yes [provider]  ?Multiple Vitamin (MULTIVITAMIN WITH MINERALS) TABS tablet Take 1 tablet by mouth daily.   Yes [provider]  ?naproxen (NAPROSYN) 500 MG tablet Take 1 tablet (500 mg total) by mouth 2 (two) times daily with a meal. 12/11/21  Yes Carrie Mew, MD  ?niacin (NIASPAN) 1000 MG CR tablet Take 1,000 mg by mouth 2 (two) times daily. 01/01/20  Yes [provider]  ?Omega-3 Fatty Acids (FISH OIL) 1000 MG CAPS Take 1,000 mg by mouth 2 (two) times daily.    Yes [provider]  ?omeprazole (PRILOSEC) 40 MG capsule Take 40 mg by mouth daily. 03/11/20  Yes [provider]  ?ondansetron (ZOFRAN-ODT) 4 MG disintegrating tablet Take 1 tablet (4 mg total) by mouth every 8 (eight) hours as needed for nausea or vomiting. 12/11/21  Yes Carrie Mew, MD  ?oxyCODONE (ROXICODONE) 5 MG immediate release tablet Take 1 tablet (5 mg total) by mouth every 4 (four) hours as needed for up to 3 days for breakthrough pain. 12/11/21 12/14/21 Yes Carrie Mew, MD  ?sotalol (BETAPACE) 80 MG tablet Take 1 tablet (80 mg total) by mouth daily. 06/23/17  Yes Henreitta Leber, MD  ?tamsulosin  (FLOMAX) 0.4 MG CAPS capsule Take 0.8 mg by mouth daily.    Yes [provider]  ?Ascorbic Acid (VITAMIN C) 1000 MG tablet Take 1,000 mg by mouth 3 (three) times daily. ?Patient not taking: Reported on 12/11/2021    [provider]  ?blood glucose meter kit and supplies KIT Dispense based on patient and insurance preference. Use up to four times daily as directed. (FOR ICD-9 250.00, 250.01). 03/24/20   Arrien, Jimmy Picket, MD  ?Arne [place] 2.5 MG TABS tablet Take 2.5 mg by mouth 2 (two) times daily. 01/09/20   [provider]  ?ferrous sulfate 325 (65 FE) MG tablet Take 325 mg by mouth every other day. ?Patient not taking: Reported on 12/10/2021  [provider]  ?finasteride (PROSCAR) 5 MG tablet Take 5 mg by mouth daily. ?Patient not taking: Reported on 12/10/2021 02/06/20   [provider]  ?spironolactone (ALDACTONE) 25 MG tablet Take 25 mg by mouth daily as needed. ?Patient not taking: Reported on 12/10/2021    [provider]  ? ? ?Physical Exam: ?Vitals:  ? 12/11/21 1430 12/11/21 1500 12/11/21 1530 12/11/21 1600  ?BP: 137/77 102/64 121/69 137/64  ?Pulse: 75 68 65 80  ?Resp: _0 ?Temp:      ?TempSrc:      ?SpO2: 97% 93% 92% 94%  ?Weight:      ?Height:      ? ?Physical Exam ?Vitals and nursing note reviewed.  ?Constitutional:   ?   Appearance: Normal appearance.  ?HENT:  ?   Head: Normocephalic and atraumatic.  ?   Ears:  ?   Comments: Patient is hard of hearing ?   Nose: Nose normal.  ?   Mouth/Throat:  ?   Mouth: Mucous membranes are moist.  ?Eyes:  ?   Pupils: Pupils are equal, round, and reactive to light.  ?Cardiovascular:  ?   Rate and Rhythm: Normal rate and regular rhythm.  ?Pulmonary:  ?   Effort: Pulmonary effort is normal.  ?   Breath sounds: Normal breath sounds.  ?Abdominal:  ?   General: Abdomen is flat. Bowel sounds are normal.  ?   Palpations: Abdomen is soft.  ?Musculoskeletal:  ?   Cervical back: Normal range of motion and neck supple.   ?   Comments: Decreased range of motion right shoulder  ?Skin: ?   General: Skin is warm and dry.  ?Neurological:  ?   General: No focal deficit present.  ?   Mental Status: She is alert and oriented to p

## 2021-12-11 NOTE — Assessment & Plan Note (Addendum)
-   Continue home meds at discharge ?

## 2021-12-11 NOTE — Assessment & Plan Note (Addendum)
-   Fractures involving right first, third, and fourth ribs as well as T2 right transverse process ?- outpatient physical therapy with [facility] ?- Continue supportive care and pain control discharge ?- Okay for up to 2 g Tylenol daily in setting of cirrhosis as long as short-term use which I have discussed with patient and his wife.  Okay to rotate with Aleve short-term as well.  He was also given a prescription for oxycodone from the ER as well. ?-He is using incentive spirometer extremely well ?

## 2021-12-11 NOTE — Assessment & Plan Note (Signed)
Stable ?Continue citalopram ?

## 2021-12-11 NOTE — Assessment & Plan Note (Signed)
Patient has a history of chronic A-fib ?Continue sotalol for rate control ?Continue Eliquis as primary prophylaxis for an acute stroke ?

## 2021-12-11 NOTE — ED Notes (Signed)
Per EDP, no iv access needed at this time.  ?

## 2021-12-12 ENCOUNTER — Encounter: Payer: Self-pay | Admitting: Internal Medicine

## 2021-12-12 DIAGNOSIS — S2241XA Multiple fractures of ribs, right side, initial encounter for closed fracture: Secondary | ICD-10-CM

## 2021-12-12 DIAGNOSIS — I482 Chronic atrial fibrillation, unspecified: Secondary | ICD-10-CM | POA: Diagnosis not present

## 2021-12-12 DIAGNOSIS — W19XXXD Unspecified fall, subsequent encounter: Secondary | ICD-10-CM | POA: Diagnosis not present

## 2021-12-12 LAB — BASIC METABOLIC PANEL
Anion gap: 6 (ref 5–15)
BUN: 21 mg/dL (ref 8–23)
CO2: 26 mmol/L (ref 22–32)
Calcium: 8.6 mg/dL — ABNORMAL LOW (ref 8.9–10.3)
Chloride: 105 mmol/L (ref 98–111)
Creatinine, Ser: 1.27 mg/dL — ABNORMAL HIGH (ref 0.61–1.24)
GFR, Estimated: 58 mL/min — ABNORMAL LOW (ref >60)
Glucose, Bld: 217 mg/dL — ABNORMAL HIGH (ref 70–99)
Potassium: 4.6 mmol/L (ref 3.5–5.1)
Sodium: 137 mmol/L (ref 135–145)

## 2021-12-12 LAB — HEMOGLOBIN A1C
Hgb A1c MFr Bld: 6.8 % — ABNORMAL HIGH (ref 4.8–5.6)
Mean Plasma Glucose: 148 mg/dL

## 2021-12-12 LAB — GLUCOSE, CAPILLARY
Glucose-Capillary: 207 mg/dL — ABNORMAL HIGH (ref 70–99)
Glucose-Capillary: 179 mg/dL — ABNORMAL HIGH (ref 70–99)

## 2021-12-12 LAB — CBC
HCT: 36.4 % — ABNORMAL LOW (ref 39.0–52.0)
Hemoglobin: 12.2 g/dL — ABNORMAL LOW (ref 13.0–17.0)
MCH: 32.7 pg (ref 26.0–34.0)
MCHC: 33.5 g/dL (ref 30.0–36.0)
MCV: 97.6 fL (ref 80.0–100.0)
Platelets: 91 10*3/uL — ABNORMAL LOW (ref 150–400)
RBC: 3.73 MIL/uL — ABNORMAL LOW (ref 4.22–5.81)
RDW: 12.3 % (ref 11.5–15.5)
WBC: 6.2 10*3/uL (ref 4.0–10.5)
nRBC: 0 % (ref 0.0–0.2)

## 2021-12-12 MED ORDER — INSULIN NPH (HUMAN) (ISOPHANE) 100 UNIT/ML ~~LOC~~ SUSP
31.0000 [IU] | Freq: Once | SUBCUTANEOUS | Status: DC
  Filled 2021-12-12: qty 10

## 2021-12-12 MED ORDER — INSULIN DETEMIR 100 UNIT/ML ~~LOC~~ SOLN
31.0000 [IU] | Freq: Once | SUBCUTANEOUS | Status: AC
  Administered 2021-12-12: 31 [IU] via SUBCUTANEOUS
  Filled 2021-12-12: qty 0.31

## 2021-12-12 MED ORDER — ACETAMINOPHEN 500 MG PO TABS: 500.0000 mg | ORAL_TABLET | Freq: Four times a day (QID) | ORAL | Status: DC

## 2021-12-12 MED ORDER — ACETAMINOPHEN 500 MG PO TABS
1000.0000 mg | ORAL_TABLET | Freq: Four times a day (QID) | ORAL | Status: DC
  Administered 2021-12-12: 1000 mg via ORAL
  Filled 2021-12-12: qty 2

## 2021-12-12 MED ORDER — ACETAMINOPHEN 500 MG PO TABS: 500.0000 mg | ORAL_TABLET | Freq: Four times a day (QID) | ORAL | 0 refills | Status: AC | PRN

## 2021-12-12 MED ORDER — KETOROLAC TROMETHAMINE 30 MG/ML IJ SOLN
30.0000 mg | Freq: Once | INTRAMUSCULAR | Status: AC
  Administered 2021-12-12: 30 mg via INTRAVENOUS
  Filled 2021-12-12: qty 1

## 2021-12-12 MED ORDER — TRAMADOL HCL 50 MG PO TABS
50.0000 mg | ORAL_TABLET | Freq: Once | ORAL | Status: AC
  Administered 2021-12-12: 50 mg via ORAL
  Filled 2021-12-12: qty 1

## 2021-12-12 NOTE — Progress Notes (Signed)
Cross Cover ?Additional dose of tramadol ordered for unresolved pain 7/10 after max prn doses of previously ordered tramadol and oxy were given ?

## 2021-12-12 NOTE — TOC Progression Note (Signed)
Transition of Care (TOC) - Progression Note  ? ? ?Patient Details  ?Name: Bernard Pennington ?MRN: [id number] ?Date of Birth: [birth year] ? ?Transition of Care (TOC) CM/SW Contact  ?Conception Oms, RN ?Phone Number: ?12/12/2021, 11:05 AM ? ?Clinical Narrative:   [name] the patient wished to go to to [place],  appointment Tuesday at 115, faxed over to [facility] [phone] ? ? ? ?  ?  ? ?Expected Discharge Plan and Services ?  ?  ?  ?  ?  ?                ?  ?  ?  ?  ?  ?  ?  ?  ?  ?  ? ? ?Social Determinants of Health (SDOH) Interventions ?  ? ?Readmission Risk Interventions ?No flowsheet data found. ? ?

## 2021-12-12 NOTE — Hospital Course (Signed)
Bernard Pennington is a 76 year old male with PMH DM II, chronic A-fib, cirrhosis, HTN, depression, BPH, GERD, esophageal varices, HLD, peripheral neuropathy, sleep apnea, ulcerative colitis who presented to the hospital after falling out of his bed at home.  He had reported that he woke up from a night terror and had flung himself unknowingly out of bed landing onto his right side.  He presented to the ER for right-sided chest wall pain after this. ?He underwent imaging studies with CT head, CT cervical spine, right shoulder x-ray, hip x-ray, right arm x-ray.  Notable finding was right-sided rib fractures involving ribs 1, 3, 4.  There was also a T2 transverse process fracture on the right portion. ?He was admitted for pain control and PT evaluation.  He was recommended for outpatient PT at discharge and ongoing supportive care and pain control. ?

## 2021-12-12 NOTE — Progress Notes (Addendum)
1211 ?Pt sitting to edge of bed for lunch states pain is controlled and very tolerable 2/10. Call bell and possessions within reach  ? ?1351 ?D/C AVS completed and reviewed with pt. All opportunities for questions answered and clarified. IV removed. Pt will be wheeled down to car at medical mall entrance via wheelchair. ? ?

## 2021-12-12 NOTE — Progress Notes (Addendum)
Inpatient Diabetes Program Recommendations ? ?AACE/ADA: New Consensus Statement on Inpatient Glycemic Control (2015) ? ?Target Ranges:  Prepandial:   less than 140 mg/dL ?     Peak postprandial:   less than 180 mg/dL (1-2 hours) ?     Critically ill patients:  140 - 180 mg/dL  ? ?Lab Results  ?Component Value Date  ? GLUCAP 207 (H) 12/12/2021  ? HGBA1C 5.7 (H) 06/22/2017  ? ? ?Review of Glycemic Control ? Latest Reference Range & Units 12/11/21 17:07 12/12/21 07:45  ?Glucose-Capillary 70 - 99 mg/dL 251 (H) 207 (H)  ? ?Diabetes history: DM 2 ?Outpatient Diabetes medications:  ?NPH 31 units q AM and NPH 29 units q PM ?Metformin 500 mg bid ?Novolog 10 units tid with meals  ?Current orders for Inpatient glycemic control:  ?Novolog moderate tid with meals ? ?Inpatient Diabetes Program Recommendations:   ? ?Please consider adding basal insulin. Consider adding Levemir 15 units bid.  ? ?Thanks,  ?Adah Perl, RN, BC-ADM ?Inpatient Diabetes Coordinator ?Pager [phone]  (8a-5p) ? ? ?

## 2021-12-12 NOTE — Evaluation (Signed)
Physical Therapy Evaluation ?Patient Details ?Name: Bernard Pennington ?MRN: [id number] ?DOB: [birth year] ?Today's Date: 12/12/2021 ? ?History of Present Illness ? Pt is a 76 y.o. adult with medical history significant for ulcerative colitis, diabetes mellitus, liver cirrhosis with portal hypertension, A-fib, TIA who presents to the ER for evaluation after a fall from bed at home.  Pt diagnosed with multiple R rib fractures. ?  ?Clinical Impression ? Pt was pleasant and motivated to participate during the session and put forth good effort throughout. Pt required no physical assistance this session. Pt reported that prior to Covid he was active and exercised at his local community center but since Covid has been very sedentary.  Pt reported mostly only ambulating household distances recently and spending much of his time in his recliner.  Pt showed very good motivation during the session and was able to amb 2 x 125' as well as ascend/descend 4 steps with one rail with no physical assistance but with slow cadence and short B step length.  Pt was steady both with a RW and without an AD and requested no RW at discharge. Pt will benefit from OPPT upon discharge to safely address deficits listed in patient problem list for decreased caregiver assistance and eventual return to PLOF. ?  ?   ?   ? ?Recommendations for follow up therapy are one component of a multi-disciplinary discharge planning process, led by the attending physician.  Recommendations may be updated based on patient status, additional functional criteria and insurance authorization. ? ?Follow Up Recommendations Outpatient PT ? ?  ?Assistance Recommended at Discharge Intermittent Supervision/Assistance  ?Patient can return home with the following ? A little help with walking and/or transfers;Assist for transportation;Help with stairs or ramp for entrance ? ?  ?Equipment Recommendations None recommended by PT  ?Recommendations for Other Services ?    ?   ?Functional Status Assessment Patient has had a recent decline in their functional status and demonstrates the ability to make significant improvements in function in a reasonable and predictable amount of time.  ? ?  ?Precautions / Restrictions Precautions ?Precautions: Fall ?Restrictions ?Weight Bearing Restrictions: No ?Other Position/Activity Restrictions: Pt may wear R shoulder sling for comfort  ? ?  ? ?Mobility ? Bed Mobility ?Overal bed mobility: Modified Independent ?  ?  ?  ?  ?  ?  ?General bed mobility comments: Min extra time and effort only ?  ? ?Transfers ?Overall transfer level: Needs assistance ?Equipment used: None ?Transfers: Sit to/from Stand ?Sit to Stand: Supervision ?  ?  ?  ?  ?  ?General transfer comment: Fair eccentric and concentric control ?  ? ?Ambulation/Gait ?Ambulation/Gait assistance: Supervision ?Gait Distance (Feet): 125 Feet x 2 ?Assistive device: Rolling walker (2 wheels), None ?Gait Pattern/deviations: Step-through pattern, Decreased step length - right, Decreased step length - left ?Gait velocity: decreased ?  ?  ?General Gait Details: Amb 2 x 125 including amb with a RW and without an AD; pt steady throughout with slow cadence and short B step length ? ?Stairs ?Stairs: Yes ?Stairs assistance: Supervision ?Stair Management: One rail Right, Sideways, Step to pattern ?Number of Stairs: 4 ?General stair comments: Pt able to ascend/descend 4 steps with one rail with good stability and with no adverse symptoms ? ?Wheelchair Mobility ?  ? ?Modified Rankin (Stroke Patients Only) ?  ? ?  ? ?Balance Overall balance assessment: Needs assistance ?  ?Sitting balance-Leahy Scale: Normal ?  ?  ?Standing balance support: No upper extremity supported, During  functional activity ?Standing balance-Leahy Scale: Good ?  ?  ?  ?  ?  ?  ?  ?  ?  ?  ?  ?  ?   ? ? ? ?Pertinent Vitals/Pain Pain Assessment ?Pain Assessment: 0-10 ?Pain Score: 4  ?Pain Location: R side ?Pain Descriptors / Indicators:  Sore ?Pain Intervention(s): Repositioned, Premedicated before session, Monitored during session  ? ? ?Home Living Family/patient expects to be discharged to:: Private residence ?Living Arrangements: Spouse/significant other ?Available Help at Discharge: Family;Available 24 hours/day ?Type of Home: House ?Home Access: Stairs to enter ?Entrance Stairs-Rails: Right;Left;Can reach both (posts to hold on both sides) ?Entrance Stairs-Number of Steps: 3 ?  ?Home Layout: One level ?Home Equipment: Shower seat;Grab bars - tub/shower ?   ?  ?Prior Function Prior Level of Function : Independent/Modified Independent ?  ?  ?  ?  ?  ?  ?Mobility Comments: Mod Ind amb limited community distances without an AD, no falls during amb, 2 falls from bed during "panic attacks" ?ADLs Comments: Ind with ADLs ?  ? ? ?Hand Dominance  ? Dominant Hand: Right ? ?  ?Extremity/Trunk Assessment  ? Upper Extremity Assessment ?Upper Extremity Assessment: Generalized weakness ?  ? ?Lower Extremity Assessment ?Lower Extremity Assessment: Generalized weakness ?  ? ?   ?Communication  ? Communication: No difficulties  ?Cognition Arousal/Alertness: Awake/alert ?Behavior During Therapy: [facility] for tasks assessed/performed ?Overall Cognitive Status: Within Functional Limits for tasks assessed ?  ?  ?  ?  ?  ?  ?  ?  ?  ?  ?  ?  ?  ?  ?  ?  ?  ?  ?  ? ?  ?General Comments   ? ?  ?Exercises    ? ?Assessment/Plan  ?  ?PT Assessment Patient needs continued PT services  ?PT Problem List Decreased strength;Decreased activity tolerance;Decreased balance;Decreased mobility ? ?   ?  ?PT Treatment Interventions DME instruction;Gait training;Stair training;Functional mobility training;Therapeutic activities;Therapeutic exercise;Balance training;Patient/family education   ? ?PT Goals (Current goals can be found in the Care Plan section)  ?Acute Rehab PT Goals ?Patient Stated Goal: Improved endurance and strength ?PT Goal Formulation: With patient ?Time For Goal  Achievement: 12/25/21 ?Potential to Achieve Goals: Good ? ?  ?Frequency Min 2X/week ?  ? ? ?Co-evaluation   ?  ?  ?  ?  ? ? ?  ?AM-PAC PT "6 Clicks" Mobility  ?Outcome Measure Help needed turning from your back to your side while in a flat bed without using bedrails?: None ?Help needed moving from lying on your back to sitting on the side of a flat bed without using bedrails?: None ?Help needed moving to and from a bed to a chair (including a wheelchair)?: A Little ?Help needed standing up from a chair using your arms (e.g., wheelchair or bedside chair)?: A Little ?Help needed to walk in hospital room?: A Little ?Help needed climbing 3-5 steps with a railing? : A Little ?6 Click Score: 20 ? ?  ?End of Session Equipment Utilized During Treatment: Gait belt ?Activity Tolerance: Patient tolerated treatment well ?Patient left: in bed;with call bell/phone within reach;with bed alarm set;with family/visitor present ?Nurse Communication: Mobility status ?PT Visit Diagnosis: Muscle weakness (generalized) (M62.81);Difficulty in walking, not elsewhere classified (R26.2) ?  ? ?Time: 7829-5621 ?PT Time Calculation (min) (ACUTE ONLY): 47 min ? ? ?Charges:   PT Evaluation ?$PT Eval Moderate Complexity: 1 Mod ?PT Treatments ?$Gait Training: 8-22 mins ?  ?   ? ?  Linus Salmons PT, DPT ?12/12/21, 1:45 PM ? ? ?

## 2021-12-12 NOTE — Discharge Summary (Signed)
?Physician Discharge Summary ?  ?Patient: Bernard Pennington MRN: [id number] DOB: Apr 28, 1946  ?Admit date:     12/11/2021  ?Discharge date: 12/12/21  ?Discharge Physician: Dwyane Dee  ? ?PCP: Valera Castle, MD  ? ?Recommendations at discharge:  ? ?Outpatient PT with [facility] ? ?Discharge Diagnoses: ?Principal Problem: ?  Fall ?Active Problems: ?  Multiple rib fractures ?  Atrial fibrillation, chronic ([place]) ?  Essential hypertension ?  Other cirrhosis of liver ([place]) ?  T2DM (type 2 diabetes mellitus) ([place]) ?  Depression ? ?Resolved Problems: ?  * No resolved hospital problems. * ? ?Hospital Course: ?Mr. Bernard Pennington is a 76 year old male with PMH DM II, chronic A-fib, cirrhosis, HTN, depression, BPH, GERD, esophageal varices, HLD, peripheral neuropathy, sleep apnea, ulcerative colitis who presented to the hospital after falling out of his bed at home.  He had reported that he woke up from a night terror and had flung himself unknowingly out of bed landing onto his right side.  He presented to the ER for right-sided chest wall pain after this. ?He underwent imaging studies with CT head, CT cervical spine, right shoulder x-ray, hip x-ray, right arm x-ray.  Notable finding was right-sided rib fractures involving ribs 1, 3, 4.  There was also a T2 transverse process fracture on the right portion. ?He was admitted for pain control and PT evaluation.  He was recommended for outpatient PT at discharge and ongoing supportive care and pain control. ? ?Assessment and Plan: ?* Fall ?Patient is status post fall out of bed with multiple rib fractures ?Pain control ?-Outpatient PT referral to [facility] ? ?Multiple rib fractures ?- Fractures involving right first, third, and fourth ribs as well as T2 right transverse process ?- outpatient physical therapy with [facility] ?- Continue supportive care and pain control discharge ?- Okay for up to 2 g Tylenol daily in setting of cirrhosis as long as short-term use  which I have discussed with patient and his wife.  Okay to rotate with Aleve short-term as well.  He was also given a prescription for oxycodone from the ER as well. ?-He is using incentive spirometer extremely well ? ?Depression ?Stable ?Continue citalopram ? ?T2DM (type 2 diabetes mellitus) ([place]) ?Maintain consistent carbohydrate diet ?-Continue home regimen at discharge ? ?Other cirrhosis of liver ([place]) ?Patient with known history of liver cirrhosis with evidence of portal hypertension ?Continue beta-blocker ?-Continue Lasix and spironolactone at discharge ? ?Essential hypertension ?- Continue home meds at discharge ? ?Atrial fibrillation, chronic ([place]) ?Patient has a history of chronic A-fib ?Continue sotalol for rate control ?Continue Eliquis as primary prophylaxis for an acute stroke ? ? ? ? ?  ? ?Consultants:  ?Procedures performed:   ?Disposition: Home ?Diet recommendation:  ?Discharge Diet Orders (From admission, onward)  ? ?  Start     Ordered  ? 12/12/21 0000  Diet - low sodium heart healthy       ? 12/12/21 1107  ? 12/12/21 0000  Diet - low sodium heart healthy       ? 12/12/21 1203  ? 12/12/21 0000  Diet Carb Modified       ? 12/12/21 1203  ? ?  ?  ? ?  ? ?Carb modified diet ?DISCHARGE MEDICATION: ?Allergies as of 12/12/2021   ? ?   Reactions  ? Wheat Bran   ? Other reaction(s): Migraine  ? ?  ? ?  ?Medication List  ?  ? ?STOP taking these medications   ? ?ferrous sulfate 325 (  65 FE) MG tablet ?  ?finasteride 5 MG tablet ?Commonly known as: PROSCAR ?  ?spironolactone 25 MG tablet ?Commonly known as: ALDACTONE ?  ?vitamin C 1000 MG tablet ?  ? ?  ? ?TAKE these medications   ? ?acetaminophen 500 MG tablet ?Commonly known as: TYLENOL ?Take 1 tablet (500 mg total) by mouth every 6 (six) hours as needed. ?  ?amLODipine 5 MG tablet ?Commonly known as: NORVASC ?Take 5 mg by mouth daily. ?  ?blood glucose meter kit and supplies Kit ?Dispense based on patient and insurance preference. Use up to four times daily  as directed. (FOR ICD-9 250.00, 250.01). ?  ?cetirizine 10 MG tablet ?Commonly known as: ZYRTEC ?Take 10 mg by mouth daily. ?  ?Cinnamon 500 MG capsule ?Take 500 mg by mouth 3 (three) times daily before meals. ?  ?citalopram 20 MG tablet ?Commonly known as: CELEXA ?Take 30 mg by mouth daily. ?  ?cloNIDine 0.1 MG tablet ?Commonly known as: CATAPRES ?Take 0.1 mg by mouth 2 (two) times daily. ?  ?Eliquis 2.5 MG Tabs tablet ?Generic drug: apixaban ?Take 2.5 mg by mouth 2 (two) times daily. ?  ?Fish Oil 1000 MG Caps ?Take 1,000 mg by mouth 2 (two) times daily. ?  ?furosemide 20 MG tablet ?Commonly known as: LASIX ?Take 20 mg by mouth daily as needed for fluid or edema. ?  ?gabapentin 800 MG tablet ?Commonly known as: NEURONTIN ?Take 800 mg by mouth 3 (three) times daily. ?  ?insulin aspart 100 UNIT/ML injection ?Commonly known as: novoLOG ?Inject 10 Units into the skin See admin instructions. Inject 10u (plus sliding scale) three times daily at mealtimes - max 50u daily ?  ?insulin NPH Human 100 UNIT/ML injection ?Commonly known as: NOVOLIN N ?Inject 29-31 Units into the skin 2 (two) times daily before a meal. Inject 31 units under the skin every morning and inject 29 units under the skin every night ?  ?lidocaine 5 % ?Commonly known as: Lidoderm ?Place 1 patch onto the skin every 12 (twelve) hours. Remove & Discard patch within 12 hours or as directed by MD ?  ?metFORMIN 500 MG tablet ?Commonly known as: GLUCOPHAGE ?Take 500 mg by mouth 2 (two) times daily with a meal. ?  ?multivitamin with minerals Tabs tablet ?Take 1 tablet by mouth daily. ?  ?naproxen 500 MG tablet ?Commonly known as: Naprosyn ?Take 1 tablet (500 mg total) by mouth 2 (two) times daily with a meal. ?  ?niacin 1000 MG CR tablet ?Commonly known as: NIASPAN ?Take 1,000 mg by mouth 2 (two) times daily. ?  ?omeprazole 40 MG capsule ?Commonly known as: PRILOSEC ?Take 40 mg by mouth daily. ?  ?ondansetron 4 MG disintegrating tablet ?Commonly known as:  ZOFRAN-ODT ?Take 1 tablet (4 mg total) by mouth every 8 (eight) hours as needed for nausea or vomiting. ?  ?oxyCODONE 5 MG immediate release tablet ?Commonly known as: Roxicodone ?Take 1 tablet (5 mg total) by mouth every 4 (four) hours as needed for up to 3 days for breakthrough pain. ?  ?sotalol 80 MG tablet ?Commonly known as: BETAPACE ?Take 1 tablet (80 mg total) by mouth daily. ?  ?tamsulosin 0.4 MG Caps capsule ?Commonly known as: FLOMAX ?Take 0.8 mg by mouth daily. ?  ? ?  ? ? Follow-up Information   ? ? Valera Castle, MD In 1 week.   ?Specialty: Family Medicine ?Contact information: ?[address] ?[place] [address] ?[phone] ? ? ?  ?  ? ?  [place].   ?Specialty: Emergency Medicine ?Why: If symptoms worsen ?Contact information: ?[id number] ?[place] ?[phone] ? ?  ?  ? ?  ?  ? ?  ? ?Discharge Exam: ?Filed Weights  ? 12/11/21 0651  ?Weight: 93 kg  ? ?Physical Exam ?Constitutional:   ?   General: She is not in acute distress. ?   Appearance: Normal appearance.  ?HENT:  ?   Head: Normocephalic and atraumatic.  ?   Mouth/Throat:  ?   Mouth: Mucous membranes are moist.  ?Eyes:  ?   Extraocular Movements: Extraocular movements intact.  ?Cardiovascular:  ?   Rate and Rhythm: Normal rate and regular rhythm.  ?   Heart sounds: Normal heart sounds.  ?Pulmonary:  ?   Effort: Pulmonary effort is normal. No respiratory distress.  ?   Breath sounds: Normal breath sounds. No wheezing.  ?Chest:  ?   Comments: Mild tenderness along right chest wall.  No guarding ?Abdominal:  ?   General: Bowel sounds are normal. There is no distension.  ?   Palpations: Abdomen is soft.  ?   Tenderness: There is no abdominal tenderness.  ?Musculoskeletal:     ?   General: Normal range of motion.  ?   Cervical back: Normal range of motion and neck supple.  ?Skin: ?   General: Skin is warm and dry.  ?Neurological:  ?   General: No  focal deficit present.  ?   Mental Status: She is alert.  ?Psychiatric:     ?   Mood and Affect: Mood normal.     ?   Behavior: Behavior normal.  ? ? ? ?Condition at discharge: stable ? ?The results of signific

## 2022-04-22 ENCOUNTER — Other Ambulatory Visit: Payer: Self-pay | Admitting: Podiatry

## 2022-04-22 DIAGNOSIS — M25372 Other instability, left ankle: Secondary | ICD-10-CM

## 2022-04-22 DIAGNOSIS — M19072 Primary osteoarthritis, left ankle and foot: Secondary | ICD-10-CM

## 2022-04-30 ENCOUNTER — Ambulatory Visit
Admission: RE | Admit: 2022-04-30 | Discharge: 2022-04-30 | Disposition: A | Discharge status: Home / Self Care | Payer: Medicare Other | Source: Ambulatory Visit | Attending: Podiatry | Admitting: Podiatry

## 2022-04-30 DIAGNOSIS — M19072 Primary osteoarthritis, left ankle and foot: Secondary | ICD-10-CM | POA: Insufficient documentation

## 2022-04-30 DIAGNOSIS — M25372 Other instability, left ankle: Secondary | ICD-10-CM | POA: Diagnosis present

## 2022-09-29 ENCOUNTER — Encounter: Payer: Self-pay | Admitting: Internal Medicine

## 2022-09-30 ENCOUNTER — Ambulatory Visit: Payer: Medicare Other | Admitting: Anesthesiology

## 2022-09-30 ENCOUNTER — Ambulatory Visit
Admission: RE | Admit: 2022-09-30 | Discharge: 2022-09-30 | Disposition: A | Discharge status: Home / Self Care | Payer: Medicare Other | Attending: Internal Medicine | Admitting: Internal Medicine

## 2022-09-30 ENCOUNTER — Encounter
Admission: RE | Disposition: A | Discharge status: Home / Self Care | Payer: Self-pay | Source: Home / Self Care | Attending: Internal Medicine

## 2022-09-30 DIAGNOSIS — Z791 Long term (current) use of non-steroidal anti-inflammatories (NSAID): Secondary | ICD-10-CM | POA: Diagnosis not present

## 2022-09-30 DIAGNOSIS — I129 Hypertensive chronic kidney disease with stage 1 through stage 4 chronic kidney disease, or unspecified chronic kidney disease: Secondary | ICD-10-CM | POA: Insufficient documentation

## 2022-09-30 DIAGNOSIS — Z87891 Personal history of nicotine dependence: Secondary | ICD-10-CM | POA: Insufficient documentation

## 2022-09-30 DIAGNOSIS — G4730 Sleep apnea, unspecified: Secondary | ICD-10-CM | POA: Diagnosis not present

## 2022-09-30 DIAGNOSIS — Z7984 Long term (current) use of oral hypoglycemic drugs: Secondary | ICD-10-CM | POA: Diagnosis not present

## 2022-09-30 DIAGNOSIS — F32A Depression, unspecified: Secondary | ICD-10-CM | POA: Insufficient documentation

## 2022-09-30 DIAGNOSIS — K219 Gastro-esophageal reflux disease without esophagitis: Secondary | ICD-10-CM | POA: Insufficient documentation

## 2022-09-30 DIAGNOSIS — N189 Chronic kidney disease, unspecified: Secondary | ICD-10-CM | POA: Diagnosis not present

## 2022-09-30 DIAGNOSIS — Z7901 Long term (current) use of anticoagulants: Secondary | ICD-10-CM | POA: Insufficient documentation

## 2022-09-30 DIAGNOSIS — I480 Paroxysmal atrial fibrillation: Secondary | ICD-10-CM | POA: Insufficient documentation

## 2022-09-30 DIAGNOSIS — K3184 Gastroparesis: Secondary | ICD-10-CM | POA: Insufficient documentation

## 2022-09-30 DIAGNOSIS — K31819 Angiodysplasia of stomach and duodenum without bleeding: Secondary | ICD-10-CM | POA: Diagnosis not present

## 2022-09-30 DIAGNOSIS — I4892 Unspecified atrial flutter: Secondary | ICD-10-CM | POA: Diagnosis not present

## 2022-09-30 DIAGNOSIS — Z79899 Other long term (current) drug therapy: Secondary | ICD-10-CM | POA: Diagnosis not present

## 2022-09-30 DIAGNOSIS — E1122 Type 2 diabetes mellitus with diabetic chronic kidney disease: Secondary | ICD-10-CM | POA: Diagnosis not present

## 2022-09-30 DIAGNOSIS — K7030 Alcoholic cirrhosis of liver without ascites: Secondary | ICD-10-CM | POA: Diagnosis not present

## 2022-09-30 DIAGNOSIS — E1143 Type 2 diabetes mellitus with diabetic autonomic (poly)neuropathy: Secondary | ICD-10-CM | POA: Diagnosis not present

## 2022-09-30 DIAGNOSIS — E785 Hyperlipidemia, unspecified: Secondary | ICD-10-CM | POA: Insufficient documentation

## 2022-09-30 DIAGNOSIS — Z794 Long term (current) use of insulin: Secondary | ICD-10-CM | POA: Diagnosis not present

## 2022-09-30 DIAGNOSIS — K3189 Other diseases of stomach and duodenum: Secondary | ICD-10-CM | POA: Diagnosis not present

## 2022-09-30 DIAGNOSIS — K766 Portal hypertension: Secondary | ICD-10-CM | POA: Insufficient documentation

## 2022-09-30 DIAGNOSIS — Z950 Presence of cardiac pacemaker: Secondary | ICD-10-CM | POA: Diagnosis not present

## 2022-09-30 HISTORY — PX: ESOPHAGOGASTRODUODENOSCOPY: SHX5428

## 2022-09-30 LAB — GLUCOSE, CAPILLARY: Glucose-Capillary: 119 mg/dL — ABNORMAL HIGH (ref 70–99)

## 2022-09-30 SURGERY — EGD (ESOPHAGOGASTRODUODENOSCOPY)
Anesthesia: General

## 2022-09-30 MED ORDER — SODIUM CHLORIDE 0.9 % IV SOLN
INTRAVENOUS | Status: DC
  Administered 2022-09-30: 20 mL/h via INTRAVENOUS

## 2022-09-30 MED ORDER — LIDOCAINE HCL (CARDIAC) PF 100 MG/5ML IV SOSY
PREFILLED_SYRINGE | INTRAVENOUS | Status: DC | PRN
  Administered 2022-09-30: 100 mg via INTRAVENOUS

## 2022-09-30 MED ORDER — PROPOFOL 10 MG/ML IV BOLUS
INTRAVENOUS | Status: DC | PRN
  Administered 2022-09-30: 120 mg via INTRAVENOUS

## 2022-09-30 NOTE — Anesthesia Preprocedure Evaluation (Addendum)
Anesthesia Evaluation  Patient identified by MRN, date of birth, ID band Patient awake    Reviewed: Allergy & Precautions, NPO status , Patient's Chart, lab work & pertinent test results  Airway Mallampati: II  TM Distance: >3 FB Neck ROM: Full    Dental  (+) Teeth Intact   Pulmonary sleep apnea and Continuous Positive Airway Pressure Ventilation , former smoker   Pulmonary exam normal  + decreased breath sounds      Cardiovascular Exercise Tolerance: Poor hypertension, Pt. on medications + dysrhythmias Atrial Fibrillation  Rhythm:Regular Rate:Normal  R sided pacemaker   Neuro/Psych  Headaches   Depression    TIAnegative neurological ROS  negative psych ROS   GI/Hepatic negative GI ROS, Neg liver ROS, PUD,GERD  ,,  Endo/Other  diabetes, Type 2    Renal/GU negative Renal ROS  negative genitourinary   Musculoskeletal   Abdominal  (+) + obese  Peds negative pediatric ROS (+)  Hematology negative hematology ROS (+) Blood dyscrasia, anemia   Anesthesia Other Findings Past Medical History: No date: A-fib (HCC) No date: Achalasia No date: BPH (benign prostatic hyperplasia) No date: Chronic kidney disease     Comment:  kidney stones No date: 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 No date: Constipation No date: Diabetes mellitus without complication (HCC) No date: Dysrhythmia     Comment:  a-fib No date: Esophageal varices (HCC) No date: Gastroparesis No date: GERD (gastroesophageal reflux disease) No date: Headache No date: Hearing loss No date: Hyperlipemia No date: Hypertension No date: Pancytopenia (HCC) No date: Peripheral neuropathy No date: Presence of permanent cardiac pacemaker No date: Sleep apnea No date: TIA (transient ischemic attack) No date: Ulcerative colitis ([place])  Past Surgical History: No date: CHOLECYSTECTOMY 08/25/2017: COLONOSCOPY WITH PROPOFOL; N/A     Comment:  Procedure:  COLONOSCOPY WITH PROPOFOL;  Surgeon: [place],               Benay Pike, MD;  Location: [facility] ENDOSCOPY;  Service:               Gastroenterology;  Laterality: N/A; 12/10/2021: COLONOSCOPY WITH PROPOFOL; N/A     Comment:  Procedure: COLONOSCOPY WITH PROPOFOL;  Surgeon: [place],               Benay Pike, MD;  Location: [facility] ENDOSCOPY;  Service:               Gastroenterology;  Laterality: N/A;  IDDM 08/25/2017: ESOPHAGOGASTRODUODENOSCOPY (EGD) WITH PROPOFOL; N/A     Comment:  Procedure: ESOPHAGOGASTRODUODENOSCOPY (EGD) WITH               PROPOFOL;  Surgeon: Toledo, Benay Pike, MD;  Location:               [facility] ENDOSCOPY;  Service: Gastroenterology;  Laterality:               N/A; No date: JOINT REPLACEMENT     Comment:  L partial knee No date: KIDNEY SURGERY No date: LITHOTRIPSY No date: VASECTOMY  BMI    Body Mass Index: 26.99 kg/m      Reproductive/Obstetrics negative OB ROS                             Anesthesia Physical Anesthesia Plan  ASA: 3  Anesthesia Plan: General   Post-op Pain Management:    Induction: Intravenous  PONV Risk Score and Plan: Propofol infusion and TIVA  Airway Management  Planned: Natural Airway  Additional Equipment:   Intra-op Plan:   Post-operative Plan:   Informed Consent: I have reviewed the patients History and Physical, chart, labs and discussed the procedure including the risks, benefits and alternatives for the proposed anesthesia with the patient or authorized representative who has indicated his/her understanding and acceptance.     Dental Advisory Given  Plan Discussed with: CRNA and Surgeon  Anesthesia Plan Comments:         Anesthesia Quick Evaluation

## 2022-09-30 NOTE — Interval H&P Note (Signed)
History and Physical Interval Note:  09/30/2022 11:43 AM  Bernard Pennington  has presented today for surgery, with the diagnosis of Portal venous hypertension (CMS-HCC) (K76.6).  The various methods of treatment have been discussed with the patient and family. After consideration of risks, benefits and other options for treatment, the patient has consented to  Procedure(s): ESOPHAGOGASTRODUODENOSCOPY (EGD) (N/A) as a surgical intervention.  The patient's history has been reviewed, patient examined, no change in status, stable for surgery.  I have reviewed the patient's chart and labs.  Questions were answered to the patient's satisfaction.     [place], [place]

## 2022-09-30 NOTE — Transfer of Care (Signed)
Immediate Anesthesia Transfer of Care Note  Patient: Bernard Pennington  Procedure(s) Performed: ESOPHAGOGASTRODUODENOSCOPY (EGD)  Patient Location: Endoscopy Unit  Anesthesia Type:General  Level of Consciousness: drowsy  Airway & Oxygen Therapy: Patient Spontanous Breathing and Patient connected to face mask oxygen  Post-op Assessment: Report given to RN, Post -op Vital signs reviewed and stable, and Patient moving all extremities  Post vital signs: Reviewed and stable  Last Vitals:  Vitals Value Taken Time  BP 134/65 09/30/22 1200  Temp 35.8 C 09/30/22 1200  Pulse 60 09/30/22 1201  Resp 15 09/30/22 1201  SpO2 99 % 09/30/22 1201  Vitals shown include unvalidated device data.  Last Pain:  Vitals:   09/30/22 1200  TempSrc: Temporal  PainSc: Asleep         Complications: No notable events documented.

## 2022-09-30 NOTE — Op Note (Signed)
[facility] Gastroenterology Patient Name: Bernard Pennington Procedure Date: 09/30/2022 11:47 AM MRN: [id number] Account #: [id number] Date of Birth: Oct 17, 1945 Admit Type: Outpatient Age: 77 Room: [facility] ENDO ROOM 2 Gender: Male Note Status: Finalized Instrument Name: Michaelle Birks [id number] Procedure:             Upper GI endoscopy Indications:           Portal venous hypertension, Gastric antral vascular                         ectasia Providers:             Benay Pike. Alice Reichert MD, MD Referring MD:          Valera Castle (Referring MD) Medicines:             Propofol per Anesthesia Complications:         No immediate complications. Procedure:             Pre-Anesthesia Assessment:                        - The risks and benefits of the procedure and the                         sedation options and risks were discussed with the                         patient. All questions were answered and informed                         consent was obtained.                        - Patient identification and proposed procedure were                         verified prior to the procedure by the nurse. The                         procedure was verified in the procedure room.                        - ASA Grade Assessment: III - A patient with severe                         systemic disease.                        - After reviewing the risks and benefits, the patient                         was deemed in satisfactory condition to undergo the                         procedure.                        After obtaining informed consent, the endoscope was                         passed under direct vision.  Throughout the procedure,                         the patient's blood pressure, pulse, and oxygen                         saturations were monitored continuously. The Endoscope                         was introduced through the mouth, and advanced to the                          third part of duodenum. The upper GI endoscopy was                         accomplished without difficulty. The patient tolerated                         the procedure well. Findings:      There is no endoscopic evidence of salmon-colored mucosa or varices in       the entire esophagus.      The examined esophagus was normal.      Mild gastric antral vascular ectasia was present in the gastric antrum.      Mild portal hypertensive gastropathy was found in the entire examined       stomach.      No other significant abnormalities were identified in a careful       examination of the stomach.      The examined duodenum was normal.      The exam was otherwise without abnormality. Impression:            - Normal esophagus.                        - Gastric antral vascular ectasia.                        - Portal hypertensive gastropathy.                        - Normal examined duodenum.                        - The examination was otherwise normal.                        - No specimens collected. Recommendation:        - Patient has a contact number available for                         emergencies. The signs and symptoms of potential                         delayed complications were discussed with the patient.                         Return to normal activities tomorrow. Written                         discharge instructions were provided to the patient.                        -  Resume previous diet.                        - Continue present medications.                        - Repeat upper endoscopy in 2 years for surveillance.                        - Return to GI office in 6 months.                        - The findings and recommendations were discussed with                         the patient. Procedure Code(s):     --- Professional ---                        [phone], Esophagogastroduodenoscopy, flexible,                         transoral; diagnostic, including collection of                          specimen(s) by brushing or washing, when performed                         (separate procedure) Diagnosis Code(s):     --- Professional ---                        K31.89, Other diseases of stomach and duodenum                        K76.6, Portal hypertension                        K31.819, Angiodysplasia of stomach and duodenum                         without bleeding CPT copyright 2022 American [employer] Association. All rights reserved. The codes documented in this report are preliminary and upon coder review may  be revised to meet current compliance requirements. Efrain Sella MD, MD 09/30/2022 12:03:57 PM This report has been signed electronically. Number of Addenda: 0 Note Initiated On: 09/30/2022 11:47 AM Estimated Blood Loss:  Estimated blood loss: none.      [facility]

## 2022-09-30 NOTE — Anesthesia Postprocedure Evaluation (Signed)
Anesthesia Post Note  Patient: Bernard Pennington  Procedure(s) Performed: ESOPHAGOGASTRODUODENOSCOPY (EGD)  Patient location during evaluation: PACU Anesthesia Type: General Level of consciousness: awake Pain management: pain level controlled Vital Signs Assessment: post-procedure vital signs reviewed and stable Respiratory status: spontaneous breathing Anesthetic complications: no  No notable events documented.   Last Vitals:  Vitals:   09/30/22 1200 09/30/22 1220  BP: 134/64   Pulse:    Resp:    Temp: (!) 35.8 C   SpO2:  100%    Last Pain:  Vitals:   09/30/22 1220  TempSrc:   PainSc: 0-No pain                 VAN STAVEREN,Desirre Eickhoff

## 2022-09-30 NOTE — H&P (Signed)
Outpatient short stay form Pre-procedure 09/30/2022 11:42 AM Rahi Chandonnet K. Alice Reichert, M.D.  Primary Physician: Valera Castle, M.D.  Reason for visit:  Portal venous hypertension, hx of GAVE  History of present illness:   77 y/o male: Portal venous hypertension (CMS-HCC) - Ambulatory Referral to Upper Endoscopy  Gastroesophageal reflux disease, unspecified whether esophagitis present  Long term current use of anticoagulant  PAF (paroxysmal atrial fibrillation) (CMS-HCC)  Intermittent atrial flutter (CMS-HCC)  Alcoholic cirrhosis, unspecified whether ascites present (CMS-HCC) Comments: Followed by [facility]  GAVE (gastric antral vascular ectasia)  History of ulcerative colitis Comments: Due for repeat colonoscopy for surveillance in 2024.     Current Facility-Administered Medications:    0.9 %  sodium chloride infusion, , Intravenous, Continuous, [place], Benay Pike, MD, Last Rate: 20 mL/hr at 09/30/22 1141, Continued from Pre-op at 09/30/22 1141  Medications Prior to Admission  Medication Sig Dispense Refill Last Dose   acetaminophen (TYLENOL) 500 MG tablet Take 1 tablet (500 mg total) by mouth every 6 (six) hours as needed. 30 tablet 0 09/29/2022   amLODipine (NORVASC) 5 MG tablet Take 5 mg by mouth daily.   09/29/2022   blood glucose meter kit and supplies KIT Dispense based on patient and insurance preference. Use up to four times daily as directed. (FOR ICD-9 250.00, 250.01). 1 each 0 09/29/2022   cetirizine (ZYRTEC) 10 MG tablet Take 10 mg by mouth daily.   09/29/2022   Cinnamon 500 MG capsule Take 500 mg by mouth 3 (three) times daily before meals.    Past Week   citalopram (CELEXA) 20 MG tablet Take 30 mg by mouth daily.   09/29/2022   cloNIDine (CATAPRES) 0.1 MG tablet Take 0.1 mg by mouth 2 (two) times daily.    09/29/2022   ELIQUIS 2.5 MG TABS tablet Take 2.5 mg by mouth 2 (two) times daily.   Past Week   furosemide (LASIX) 20 MG tablet Take 20 mg by mouth daily as  needed for fluid or edema.   09/29/2022   gabapentin (NEURONTIN) 800 MG tablet Take 800 mg by mouth 3 (three) times daily.    09/29/2022   insulin aspart (NOVOLOG) 100 UNIT/ML injection Inject 10 Units into the skin See admin instructions. Inject 10u (plus sliding scale) three times daily at mealtimes - max 50u daily 10 mL 0 Past Week   insulin NPH Human (HUMULIN N,NOVOLIN N) 100 UNIT/ML injection Inject 29-31 Units into the skin 2 (two) times daily before a meal. Inject 31 units under the skin every morning and inject 29 units under the skin every night   Past Week   lidocaine (LIDODERM) 5 % Place 1 patch onto the skin every 12 (twelve) hours. Remove & Discard patch within 12 hours or as directed by MD 15 patch 0 Past Week   metFORMIN (GLUCOPHAGE) 500 MG tablet Take 500 mg by mouth 2 (two) times daily with a meal.   09/29/2022   Multiple Vitamin (MULTIVITAMIN WITH MINERALS) TABS tablet Take 1 tablet by mouth daily.   Past Week   naproxen (NAPROSYN) 500 MG tablet Take 1 tablet (500 mg total) by mouth 2 (two) times daily with a meal. 20 tablet 0 09/29/2022   niacin (NIASPAN) 1000 MG CR tablet Take 1,000 mg by mouth 2 (two) times daily.   09/29/2022   Omega-3 Fatty Acids (FISH OIL) 1000 MG CAPS Take 1,000 mg by mouth 2 (two) times daily.    Past Week   omeprazole (PRILOSEC) 40 MG capsule Take  40 mg by mouth daily.   09/29/2022   ondansetron (ZOFRAN-ODT) 4 MG disintegrating tablet Take 1 tablet (4 mg total) by mouth every 8 (eight) hours as needed for nausea or vomiting. 20 tablet 0 Past Week   sotalol (BETAPACE) 80 MG tablet Take 1 tablet (80 mg total) by mouth daily.   09/29/2022   tamsulosin (FLOMAX) 0.4 MG CAPS capsule Take 0.8 mg by mouth daily.    09/29/2022     Allergies  Allergen Reactions   Wheat Bran     Other reaction(s): Migraine     Past Medical History:  Diagnosis Date   A-fib (HCC)    Achalasia    BPH (benign prostatic hyperplasia)    Chronic kidney disease    kidney stones   Cirrhosis  (HCC)    alcoholic   Constipation    Diabetes mellitus without complication (HCC)    Dysrhythmia    a-fib   Esophageal varices (HCC)    Gastroparesis    GERD (gastroesophageal reflux disease)    Headache    Hearing loss    Hyperlipemia    Hypertension    Pancytopenia (HCC)    Peripheral neuropathy    Presence of permanent cardiac pacemaker    Sleep apnea    TIA (transient ischemic attack)    Ulcerative colitis ([place])     Review of systems:  Otherwise negative.    Physical Exam  Gen: Alert, oriented. Appears stated age.  HEENT: [place]/AT. PERRLA. Lungs: CTA, no wheezes. CV: RR nl S1, S2. Abd: soft, benign, no masses. BS+ Ext: No edema. Pulses 2+    Planned procedures: Proceed with EGD. The patient understands the nature of the planned procedure, indications, risks, alternatives and potential complications including but not limited to bleeding, infection, perforation, damage to internal organs and possible oversedation/side effects from anesthesia. The patient agrees and gives consent to proceed.  Please refer to procedure notes for findings, recommendations and patient disposition/instructions.     Logyn Kendrick K. Alice Reichert, M.D. Gastroenterology 09/30/2022  11:42 AM

## 2022-10-01 ENCOUNTER — Encounter: Payer: Self-pay | Admitting: Internal Medicine

## 2022-12-30 ENCOUNTER — Other Ambulatory Visit: Payer: Self-pay

## 2022-12-30 ENCOUNTER — Emergency Department
Admission: EM | Admit: 2022-12-30 | Discharge: 2022-12-30 | Disposition: A | Discharge status: Home / Self Care | Payer: Medicare Other | Attending: Emergency Medicine | Admitting: Emergency Medicine

## 2022-12-30 ENCOUNTER — Emergency Department: Payer: Medicare Other

## 2022-12-30 DIAGNOSIS — Z7901 Long term (current) use of anticoagulants: Secondary | ICD-10-CM | POA: Insufficient documentation

## 2022-12-30 DIAGNOSIS — M25461 Effusion, right knee: Secondary | ICD-10-CM | POA: Insufficient documentation

## 2022-12-30 DIAGNOSIS — E119 Type 2 diabetes mellitus without complications: Secondary | ICD-10-CM | POA: Diagnosis not present

## 2022-12-30 DIAGNOSIS — I10 Essential (primary) hypertension: Secondary | ICD-10-CM | POA: Insufficient documentation

## 2022-12-30 DIAGNOSIS — M25561 Pain in right knee: Secondary | ICD-10-CM | POA: Diagnosis not present

## 2022-12-30 MED ORDER — OXYCODONE-ACETAMINOPHEN 5-325 MG PO TABS: 1.0000 | ORAL_TABLET | ORAL | 0 refills | Status: AC | PRN

## 2022-12-30 MED ORDER — OXYCODONE-ACETAMINOPHEN 5-325 MG PO TABS: 1.0000 | ORAL_TABLET | ORAL | 0 refills | Status: DC | PRN

## 2022-12-30 MED ORDER — OXYCODONE-ACETAMINOPHEN 5-325 MG PO TABS
1.0000 | ORAL_TABLET | Freq: Once | ORAL | Status: AC
  Administered 2022-12-30: 1 via ORAL
  Filled 2022-12-30: qty 1

## 2022-12-30 NOTE — ED Provider Notes (Signed)
[facility] Provider Note    Event Date/Time   First MD Initiated Contact with Patient 12/30/22 1757     (approximate)   History   Chief Complaint Knee Pain   HPI  Bernard Pennington is a 77 y.o. adult with past medical history of hypertension, hyperlipidemia, diabetes, alcohol abuse, cirrhosis, and atrial fibrillation on Eliquis who presents to the ED complaining of knee pain.  Patient reports that he had a knee replacement performed on his right knee January 16, has been doing well since then until he had a fall this morning.  Patient reports that he lost his balance getting out of bed and hit the right side of his knee on the ground.  Since then, he has had increasing swelling in his knee and significant pain when bending the knee.  He denies any pain in his hip or ankle, did not hit his head or lose consciousness with the fall.  He has not taken anything for pain prior to arrival.     Physical Exam   Triage Vital Signs: ED Triage Vitals  Enc Vitals Group     BP 12/30/22 1558 110/76     Pulse Rate 12/30/22 1558 (!) 112     Resp 12/30/22 1558 18     Temp 12/30/22 1558 97.6 F (36.4 C)     Temp Source 12/30/22 1558 Oral     SpO2 12/30/22 1558 98 %     Weight --      Height --      Head Circumference --      Peak Flow --      Pain Score 12/30/22 1534 10     Pain Loc --      Pain Edu? --      Excl. in [place]? --     Most recent vital signs: Vitals:   12/30/22 1558  BP: 110/76  Pulse: (!) 112  Resp: 18  Temp: 97.6 F (36.4 C)  SpO2: 98%    Constitutional: Alert and oriented. Eyes: Conjunctivae are normal. Head: Atraumatic. Nose: No congestion/rhinnorhea. Mouth/Throat: Mucous membranes are moist.  Cardiovascular: Normal rate, regular rhythm. Grossly normal heart sounds.  2+ radial and DP pulses bilaterally. Respiratory: Normal respiratory effort.  No retractions. Lungs CTAB. Gastrointestinal: Soft and nontender. No  distention. Musculoskeletal: Right knee edema and effusion with diffuse tenderness, no obvious deformity noted.  No erythema or warmth noted, patient able to range knee but with significant discomfort. Neurologic:  Normal speech and language. No gross focal neurologic deficits are appreciated.    ED Results / Procedures / Treatments   Labs (all labs ordered are listed, but only abnormal results are displayed) Labs Reviewed - No data to display  RADIOLOGY Right knee x-ray reviewed and interpreted by me with no fracture or dislocation.  PROCEDURES:  Critical Care performed: No  Procedures   MEDICATIONS ORDERED IN ED: Medications  oxyCODONE-acetaminophen (PERCOCET/ROXICET) 5-325 MG per tablet 1 tablet (1 tablet Oral Given 12/30/22 1815)     IMPRESSION / MDM / ASSESSMENT AND PLAN / ED COURSE  I reviewed the triage vital signs and the nursing notes.                              77 y.o. adult with past medical history of hypertension, hyperlipidemia, diabetes, atrial fibrillation on Eliquis, cirrhosis, and alcohol abuse who presents to the ED complaining of knee pain and swelling following a fall  earlier this morning.  Patient's presentation is most consistent with acute complicated illness / injury requiring diagnostic workup.  Differential diagnosis includes, but is not limited to, fracture, dislocation, effusion, hemarthrosis, ligamentous injury.  Patient uncomfortable but nontoxic-appearing and in no acute distress, vital signs remarkable for tachycardia likely secondary to pain.  He is neurovascularly intact distally in his right lower extremity and x-ray imaging is negative for acute process.  He has an effusion on exam but no findings concerning for septic arthritis or other infectious process.  He is anticoagulated and may have some hemarthrosis, but will hold off on arthrocentesis given recent knee replacement.  Patient given dose of pain medication and placed in a knee  immobilizer, is appropriate for outpatient follow-up with his orthopedic surgeon.  He was counseled to return to the ED for new or worsening symptoms, patient agrees with plan.      FINAL CLINICAL IMPRESSION(S) / ED DIAGNOSES   Final diagnoses:  Acute pain of right knee  Effusion of right knee     Rx / DC Orders   ED Discharge Orders          Ordered    oxyCODONE-acetaminophen (PERCOCET) 5-325 MG tablet  Every 4 hours PRN        12/30/22 1835             Note:  This document was prepared using Dragon voice recognition software and may include unintentional dictation errors.   Blake Divine, MD 12/30/22 [phone]

## 2022-12-30 NOTE — ED Triage Notes (Signed)
Pt to ED via [place]. Pt had right knee surgery in Jan and fell this morning. Pt started vomiting at [facility] due to pain.

## 2023-06-03 ENCOUNTER — Other Ambulatory Visit: Payer: Self-pay | Admitting: Physician Assistant

## 2023-06-03 DIAGNOSIS — I480 Paroxysmal atrial fibrillation: Secondary | ICD-10-CM

## 2023-06-03 DIAGNOSIS — R079 Chest pain, unspecified: Secondary | ICD-10-CM

## 2023-06-03 DIAGNOSIS — E782 Mixed hyperlipidemia: Secondary | ICD-10-CM

## 2023-06-03 DIAGNOSIS — E1159 Type 2 diabetes mellitus with other circulatory complications: Secondary | ICD-10-CM

## 2023-06-04 ENCOUNTER — Telehealth (HOSPITAL_COMMUNITY): Payer: Self-pay | Admitting: Emergency Medicine

## 2023-06-04 NOTE — Telephone Encounter (Signed)
Reaching out to patient to offer assistance regarding upcoming cardiac imaging study; pt verbalizes understanding of appt date/time, parking situation and where to check in, pre-test NPO status and medications ordered, and verified current allergies; name and call back number provided for further questions should they arise Sara Wallace RN Navigator Cardiac Imaging [facility] [phone] office [phone] cell 

## 2023-06-07 ENCOUNTER — Ambulatory Visit: Admission: RE | Admit: 2023-06-07 | Payer: Medicare Other | Source: Ambulatory Visit

## 2023-06-08 ENCOUNTER — Telehealth (HOSPITAL_COMMUNITY): Payer: Self-pay | Admitting: Emergency Medicine

## 2023-06-08 NOTE — Telephone Encounter (Signed)
Attempted to call patient regarding upcoming cardiac CT appointment. °Left message on voicemail with name and callback number °Sara Wallace RN Navigator Cardiac Imaging °[facility] °[phone] Office °[phone] Cell ° °

## 2023-06-09 ENCOUNTER — Ambulatory Visit
Admission: RE | Admit: 2023-06-09 | Discharge: 2023-06-09 | Disposition: A | Discharge status: Home / Self Care | Payer: Medicare Other | Source: Ambulatory Visit | Attending: Physician Assistant | Admitting: Physician Assistant

## 2023-06-09 DIAGNOSIS — R079 Chest pain, unspecified: Secondary | ICD-10-CM | POA: Diagnosis present

## 2023-06-09 DIAGNOSIS — I152 Hypertension secondary to endocrine disorders: Secondary | ICD-10-CM | POA: Insufficient documentation

## 2023-06-09 DIAGNOSIS — E1159 Type 2 diabetes mellitus with other circulatory complications: Secondary | ICD-10-CM | POA: Insufficient documentation

## 2023-06-09 DIAGNOSIS — I480 Paroxysmal atrial fibrillation: Secondary | ICD-10-CM | POA: Diagnosis present

## 2023-06-09 DIAGNOSIS — E782 Mixed hyperlipidemia: Secondary | ICD-10-CM | POA: Insufficient documentation

## 2023-06-09 LAB — POCT I-STAT CREATININE: Creatinine, Ser: 1.3 mg/dL — ABNORMAL HIGH (ref 0.61–1.24)

## 2023-06-09 MED ORDER — NITROGLYCERIN 0.4 MG SL SUBL
0.8000 mg | SUBLINGUAL_TABLET | Freq: Once | SUBLINGUAL | Status: AC
  Administered 2023-06-09: 0.8 mg via SUBLINGUAL

## 2023-06-09 MED ORDER — IOHEXOL 350 MG/ML SOLN
80.0000 mL | Freq: Once | INTRAVENOUS | Status: AC | PRN
  Administered 2023-06-09: 80 mL via INTRAVENOUS

## 2023-06-09 NOTE — Progress Notes (Signed)

## 2024-07-24 ENCOUNTER — Other Ambulatory Visit: Payer: Self-pay | Admitting: Internal Medicine

## 2024-07-24 DIAGNOSIS — N6315 Unspecified lump in the right breast, overlapping quadrants: Secondary | ICD-10-CM

## 2024-07-26 ENCOUNTER — Ambulatory Visit
Admission: RE | Admit: 2024-07-26 | Discharge: 2024-07-26 | Disposition: A | Discharge status: Home / Self Care | Source: Ambulatory Visit | Attending: Internal Medicine | Admitting: Internal Medicine

## 2024-07-26 DIAGNOSIS — N6315 Unspecified lump in the right breast, overlapping quadrants: Secondary | ICD-10-CM
